# Patient Record
Sex: Female | Born: 1965 | Race: White | Hispanic: No | Marital: Married | Smoking: Former smoker
Health system: Southern US, Community
[De-identification: ages and names within clinical notes are randomized; demographics above are authoritative.]

## PROBLEM LIST (undated history)

## (undated) DIAGNOSIS — B373 Candidiasis of vulva and vagina: Secondary | ICD-10-CM

## (undated) DIAGNOSIS — Z8614 Personal history of Methicillin resistant Staphylococcus aureus infection: Secondary | ICD-10-CM

## (undated) DIAGNOSIS — F419 Anxiety disorder, unspecified: Secondary | ICD-10-CM

## (undated) DIAGNOSIS — R7303 Prediabetes: Secondary | ICD-10-CM

## (undated) DIAGNOSIS — G4730 Sleep apnea, unspecified: Secondary | ICD-10-CM

## (undated) DIAGNOSIS — G4733 Obstructive sleep apnea (adult) (pediatric): Secondary | ICD-10-CM

## (undated) DIAGNOSIS — J302 Other seasonal allergic rhinitis: Secondary | ICD-10-CM

## (undated) DIAGNOSIS — I10 Essential (primary) hypertension: Secondary | ICD-10-CM

## (undated) DIAGNOSIS — Z973 Presence of spectacles and contact lenses: Secondary | ICD-10-CM

## (undated) DIAGNOSIS — E669 Obesity, unspecified: Secondary | ICD-10-CM

## (undated) DIAGNOSIS — J309 Allergic rhinitis, unspecified: Secondary | ICD-10-CM

## (undated) DIAGNOSIS — D1722 Benign lipomatous neoplasm of skin and subcutaneous tissue of left arm: Secondary | ICD-10-CM

## (undated) DIAGNOSIS — F411 Generalized anxiety disorder: Secondary | ICD-10-CM

## (undated) HISTORY — DX: Allergic rhinitis, unspecified: J30.9

## (undated) HISTORY — DX: Personal history of Methicillin resistant Staphylococcus aureus infection: Z86.14

## (undated) HISTORY — DX: Anxiety disorder, unspecified: F41.9

## (undated) HISTORY — DX: Obesity, unspecified: E66.9

## (undated) HISTORY — PX: TONSILLECTOMY: SUR1361

## (undated) HISTORY — DX: Candidiasis of vulva and vagina: B37.3

---

## 1992-03-31 HISTORY — PX: TONSILLECTOMY: SUR1361

## 2006-07-29 ENCOUNTER — Ambulatory Visit: Payer: Self-pay | Admitting: Unknown Physician Specialty

## 2017-01-12 ENCOUNTER — Other Ambulatory Visit: Payer: Self-pay | Admitting: Certified Nurse Midwife

## 2017-01-12 NOTE — Telephone Encounter (Signed)
Please advise for refill. Thank you.  

## 2017-02-04 ENCOUNTER — Other Ambulatory Visit: Payer: Self-pay | Admitting: Certified Nurse Midwife

## 2017-04-09 ENCOUNTER — Other Ambulatory Visit: Payer: Self-pay | Admitting: Certified Nurse Midwife

## 2017-04-09 NOTE — Telephone Encounter (Signed)
Please advise for refill. Thank you.  

## 2017-04-17 ENCOUNTER — Ambulatory Visit (INDEPENDENT_AMBULATORY_CARE_PROVIDER_SITE_OTHER): Payer: BLUE CROSS/BLUE SHIELD | Admitting: Certified Nurse Midwife

## 2017-04-17 ENCOUNTER — Encounter: Payer: Self-pay | Admitting: Certified Nurse Midwife

## 2017-04-17 VITALS — BP 138/82 | HR 77 | Ht 67.0 in | Wt 267.0 lb

## 2017-04-17 DIAGNOSIS — Z124 Encounter for screening for malignant neoplasm of cervix: Secondary | ICD-10-CM | POA: Diagnosis not present

## 2017-04-17 DIAGNOSIS — Z01419 Encounter for gynecological examination (general) (routine) without abnormal findings: Secondary | ICD-10-CM

## 2017-04-17 DIAGNOSIS — Z1231 Encounter for screening mammogram for malignant neoplasm of breast: Secondary | ICD-10-CM

## 2017-04-17 DIAGNOSIS — R87610 Atypical squamous cells of undetermined significance on cytologic smear of cervix (ASC-US): Secondary | ICD-10-CM | POA: Diagnosis not present

## 2017-04-17 DIAGNOSIS — Z1211 Encounter for screening for malignant neoplasm of colon: Secondary | ICD-10-CM

## 2017-04-17 DIAGNOSIS — Z1239 Encounter for other screening for malignant neoplasm of breast: Secondary | ICD-10-CM

## 2017-04-17 DIAGNOSIS — F419 Anxiety disorder, unspecified: Secondary | ICD-10-CM

## 2017-04-17 MED ORDER — LEVONORGESTREL-ETHINYL ESTRAD 0.1-20 MG-MCG PO TABS: 1.0000 | ORAL_TABLET | Freq: Every day | ORAL | 11 refills | Status: DC

## 2017-04-17 MED ORDER — SERTRALINE HCL 50 MG PO TABS: ORAL_TABLET | ORAL | 0 refills | Status: DC

## 2017-04-17 NOTE — Progress Notes (Signed)
Gynecology Annual Exam  PCP: Patient, No Pcp Per  Chief Complaint:  Chief Complaint  Patient presents with  . Gynecologic Exam    History of Present Illness:Leslie Ross presents today for her annual exam. She is a 52 year old Caucasian/White female , G1 P1 whose LMP was 04/02/2017.   Her menses are monthly and last 7 days with two heavier days requiring tampon change q2-3hours are are rarely accompanied by clots. Bleeding may begin at the end of her third week of pills. She reports mild dysmenorrhea not requiring medication. The patient's past medical history is notable for a history of anxiety, obesity (BMI 41), and allergies. Currently on Zoloft 50 mgm daily for anxiety. Has been wakening more frequently with anxiety recently (mother's dementia has increased her anxiety) and is wondering if her Zoloft dose needs to be increased. She is sexually active. She is currently using birth control pills for contraception.  Her most recent pap smear was obtained 03/05/2016 and was ASCUS with negative HPV DNA.  Her most recent mammogram obtained on 03/02/2015 was normal. There is no family history of breast cancer. There is no family history of ovarian cancer. The patient does do monthly self breast exams.  The patient does not smoke.  The patient does drink occasionally.  The patient does not use illegal drugs.  The patient exercises regularly, 4x/week, in the gym, alternating cardio and weights. Her current BMI=41.8 kg/m2 The patient does not get adequate calcium in her diet and takes a supplement.  She has had a recent cholesterol screen 2017 and it was normal.      Review of Systems: Review of Systems  Constitutional: Negative for chills, fever and weight loss.  HENT: Positive for congestion. Negative for sinus pain and sore throat.   Eyes: Negative for blurred vision and pain.  Respiratory: Positive for cough. Negative for hemoptysis, shortness of breath and wheezing.     Cardiovascular: Negative for chest pain, palpitations and leg swelling.  Gastrointestinal: Negative for abdominal pain, blood in stool, diarrhea, heartburn, nausea and vomiting.  Genitourinary: Negative for dysuria, frequency, hematuria and urgency.  Musculoskeletal: Negative for back pain, joint pain and myalgias.  Skin: Negative for itching and rash.  Neurological: Negative for dizziness, tingling and headaches.  Endo/Heme/Allergies: Negative for environmental allergies and polydipsia. Does not bruise/bleed easily.       Negative for hot flashes   Psychiatric/Behavioral: Negative for depression. The patient is nervous/anxious. The patient does not have insomnia.     Past Medical History:  Past Medical History:  Diagnosis Date  . Allergic rhinitis   . Anxiety   . Candidal vaginitis   . History of MRSA infection   . Obesity    BMI 41.8 04/17/2017    Past Surgical History:  Past Surgical History:  Procedure Laterality Date  . CESAREAN SECTION  12/1997  . TONSILLECTOMY      Family History:  Family History  Problem Relation Age of Onset  . Dementia Mother 108       Alzheimers  . Heart disease Father   . Aneurysm Father   . Diabetes Maternal Grandmother   . Heart disease Paternal Grandmother   . Melanoma Maternal Grandfather     Social History:  Social History   Socioeconomic History  . Marital status: Married    Spouse name: Dominica Severin  . Number of children: 1  . Years of education: 12+  . Highest education level: Not on file  Social Needs  .  Financial resource strain: Not on file  . Food insecurity - worry: Not on file  . Food insecurity - inability: Not on file  . Transportation needs - medical: Not on file  . Transportation needs - non-medical: Not on file  Occupational History  . Not on file  Tobacco Use  . Smoking status: Former Research scientist (life sciences)  . Smokeless tobacco: Never Used  . Tobacco comment: quit smoking in 2009  Substance and Sexual Activity  . Alcohol use:  Yes    Comment: occasionally  . Drug use: No  . Sexual activity: Yes    Partners: Male    Birth control/protection: Pill  Other Topics Concern  . Not on file  Social History Narrative  . Not on file    Allergies:  No Known Allergies  Medications:  Current Outpatient Medications:  .  b complex vitamins capsule, Take 1 capsule by mouth daily., Disp: , Rfl:  .  Biotin 1 MG CAPS, Take 1 capsule by mouth daily., Disp: , Rfl:  .  calcium-vitamin D (OSCAL WITH D) 500-200 MG-UNIT tablet, Take 1 tablet by mouth., Disp: , Rfl:  .  cetirizine (ZYRTEC) 10 MG tablet, Take 10 mg by mouth daily., Disp: , Rfl:  .  Cholecalciferol (VITAMIN D3) 1000 units CAPS, Take 1 capsule by mouth daily., Disp: , Rfl:  .  Cupric Sulfate (CU-25) 25 MG CAPS, Take by mouth., Disp: , Rfl:  .  levonorgestrel-ethinyl estradiol (VIENVA) 0.1-20 MG-MCG tablet, Take 1 tablet by mouth daily., Disp: 28 tablet, Rfl: 11 .  magnesium oxide (MAG-OX) 400 MG tablet, Take 400 mg by mouth daily., Disp: , Rfl:  .  sertraline (ZOLOFT) 50 MG tablet, Take 75 mgm daily ( 1.5 tablets), Disp: 45 tablet, Rfl: 0           Physical Exam Vitals: BP 138/82   Pulse 77   Ht 5\' 7"  (1.702 m)   Wt 267 lb (121.1 kg)   LMP 04/02/2017 (Exact Date)   BMI 41.82 kg/m   General: Wf in NAD HEENT: normocephalic, anicteric Neck: no thyroid enlargement, no palpable nodules, no cervical lymphadenopathy  Pulmonary: No increased work of breathing, CTAB Cardiovascular: RRR, without murmur  Breast: Breast symmetrical, no tenderness, no palpable nodules or masses, no skin or nipple retraction present, no nipple discharge.  No axillary, infraclavicular or supraclavicular lymphadenopathy. Abdomen: Soft, non-tender,obese, non-distended.  Umbilicus without lesions.  No hepatomegaly or masses palpable. No evidence of hernia. Genitourinary:  External: Normal external female genitalia.  Normal urethral meatus, normal Bartholin's and Skene's glands.    Vagina:  Normal vaginal mucosa, no evidence of prolapse.    Cervix: Grossly normal in appearance, no bleeding, non-tender  Uterus: Retroverted, normal size, shape, and consistency, mobile, and non-tender  Adnexa: No adnexal masses, non-tender  Rectal: deferred  Lymphatic: no evidence of inguinal lymphadenopathy Extremities: no edema, erythema, or tenderness Neurologic: Grossly intact Psychiatric: mood appropriate, affect full  GAD 7 score on Zoloft 50 mgm daily is 7 and somewhat difficult    Assessment: 52 y.o. annual gyn exam Anxiety ASCUS PAP with negative HRHPV   Plan:   1) Breast cancer screening - recommend monthly self breast exam and annual screening mammograms Mammogram was ordered today.  2) Colon cancer screening-will refer for colonoscopy  3) Cervical cancer screening - Pap was done.  4) Contraception - Refill of Aviane generic sent to pharmacy #1/RF x 11  5) Routine healthcare maintenance including cholesterol and diabetes screening UTD   6) Increased dose of  Zoloft to 75 mgm daily. Telephone follow up in 3 months or sooner if needed  7) RTO 1 year for annual and prn  Dalia Heading, CNM

## 2017-04-20 LAB — IGP,RFX APTIMA HPV ALL PTH: PAP Smear Comment: 0

## 2017-05-04 ENCOUNTER — Telehealth: Payer: Self-pay

## 2017-05-04 NOTE — Telephone Encounter (Signed)
Gastroenterology Pre-Procedure Review  Request Date:   Requesting Physician: Dr. Marius Ditch   PATIENT REVIEW QUESTIONS: The patient responded to the following health history questions as indicated:    1. Are you having any GI issues? No  2. Do you have a personal history of Polyps? No  3. Do you have a family history of Colon Cancer or Polyps? No  4. Diabetes Mellitus?  No  5. Joint replacements in the past 12 months? No  6. Major health problems in the past 3 months? No  7. Any artificial heart valves, MVP, or defibrillator? No     MEDICATIONS & ALLERGIES:    Patient reports the following regarding taking any anticoagulation/antiplatelet therapy:   Plavix, Coumadin, Eliquis, Xarelto, Lovenox, Pradaxa, Brilinta, or Effient? No  Aspirin? No   Patient confirms/reports the following medications:  Current Outpatient Medications  Medication Sig Dispense Refill  . b complex vitamins capsule Take 1 capsule by mouth daily.    . Biotin 1 MG CAPS Take 1 capsule by mouth daily.    . calcium-vitamin D (OSCAL WITH D) 500-200 MG-UNIT tablet Take 1 tablet by mouth.    . cetirizine (ZYRTEC) 10 MG tablet Take 10 mg by mouth daily.    . Cholecalciferol (VITAMIN D3) 1000 units CAPS Take 1 capsule by mouth daily.    . Cupric Sulfate (CU-25) 25 MG CAPS Take by mouth.    . levonorgestrel-ethinyl estradiol (VIENVA) 0.1-20 MG-MCG tablet Take 1 tablet by mouth daily. 28 tablet 11  . magnesium oxide (MAG-OX) 400 MG tablet Take 400 mg by mouth daily.    . sertraline (ZOLOFT) 50 MG tablet Take 75 mgm daily ( 1.5 tablets) 45 tablet 0   No current facility-administered medications for this visit.     Patient confirms/reports the following allergies:  No Known Allergies  No orders of the defined types were placed in this encounter.   AUTHORIZATION INFORMATION Primary Insurance: 1D#: Group #:  Secondary Insurance: 1D#: Group #:  SCHEDULE INFORMATION: Date: 05/20/17 Time: Location: [place]

## 2017-05-05 ENCOUNTER — Other Ambulatory Visit: Payer: Self-pay

## 2017-05-05 DIAGNOSIS — Z1211 Encounter for screening for malignant neoplasm of colon: Secondary | ICD-10-CM

## 2017-05-05 DIAGNOSIS — Z1212 Encounter for screening for malignant neoplasm of rectum: Principal | ICD-10-CM

## 2017-05-11 ENCOUNTER — Other Ambulatory Visit: Payer: Self-pay

## 2017-05-11 DIAGNOSIS — Z1211 Encounter for screening for malignant neoplasm of colon: Secondary | ICD-10-CM

## 2017-05-20 ENCOUNTER — Ambulatory Visit: Admit: 2017-05-20 | Payer: Self-pay | Admitting: Gastroenterology

## 2017-05-20 SURGERY — COLONOSCOPY WITH PROPOFOL
Anesthesia: Choice

## 2017-05-25 DIAGNOSIS — I10 Essential (primary) hypertension: Secondary | ICD-10-CM | POA: Diagnosis not present

## 2017-05-25 DIAGNOSIS — J069 Acute upper respiratory infection, unspecified: Secondary | ICD-10-CM | POA: Diagnosis not present

## 2017-06-29 ENCOUNTER — Encounter: Payer: Self-pay | Admitting: Emergency Medicine

## 2017-06-30 ENCOUNTER — Encounter
Admission: RE | Disposition: A | Discharge status: Home / Self Care | Payer: Self-pay | Source: Ambulatory Visit | Attending: Gastroenterology

## 2017-06-30 ENCOUNTER — Ambulatory Visit
Admission: RE | Admit: 2017-06-30 | Discharge: 2017-06-30 | Disposition: A | Discharge status: Home / Self Care | Payer: BLUE CROSS/BLUE SHIELD | Source: Ambulatory Visit | Attending: Gastroenterology | Admitting: Gastroenterology

## 2017-06-30 ENCOUNTER — Ambulatory Visit: Payer: BLUE CROSS/BLUE SHIELD | Admitting: Anesthesiology

## 2017-06-30 ENCOUNTER — Other Ambulatory Visit: Payer: Self-pay

## 2017-06-30 ENCOUNTER — Encounter: Payer: Self-pay | Admitting: *Deleted

## 2017-06-30 DIAGNOSIS — Z8614 Personal history of Methicillin resistant Staphylococcus aureus infection: Secondary | ICD-10-CM | POA: Insufficient documentation

## 2017-06-30 DIAGNOSIS — K5732 Diverticulitis of large intestine without perforation or abscess without bleeding: Secondary | ICD-10-CM | POA: Diagnosis not present

## 2017-06-30 DIAGNOSIS — Z87891 Personal history of nicotine dependence: Secondary | ICD-10-CM | POA: Insufficient documentation

## 2017-06-30 DIAGNOSIS — D125 Benign neoplasm of sigmoid colon: Secondary | ICD-10-CM | POA: Diagnosis not present

## 2017-06-30 DIAGNOSIS — Z6841 Body Mass Index (BMI) 40.0 and over, adult: Secondary | ICD-10-CM | POA: Diagnosis not present

## 2017-06-30 DIAGNOSIS — K5730 Diverticulosis of large intestine without perforation or abscess without bleeding: Secondary | ICD-10-CM | POA: Diagnosis not present

## 2017-06-30 DIAGNOSIS — Z1211 Encounter for screening for malignant neoplasm of colon: Secondary | ICD-10-CM | POA: Diagnosis not present

## 2017-06-30 DIAGNOSIS — Z79899 Other long term (current) drug therapy: Secondary | ICD-10-CM | POA: Insufficient documentation

## 2017-06-30 DIAGNOSIS — K635 Polyp of colon: Secondary | ICD-10-CM | POA: Diagnosis not present

## 2017-06-30 DIAGNOSIS — Z8249 Family history of ischemic heart disease and other diseases of the circulatory system: Secondary | ICD-10-CM | POA: Insufficient documentation

## 2017-06-30 DIAGNOSIS — F419 Anxiety disorder, unspecified: Secondary | ICD-10-CM | POA: Insufficient documentation

## 2017-06-30 HISTORY — PX: COLONOSCOPY WITH PROPOFOL: SHX5780

## 2017-06-30 SURGERY — COLONOSCOPY WITH PROPOFOL
Anesthesia: General

## 2017-06-30 MED ORDER — PROPOFOL 500 MG/50ML IV EMUL
INTRAVENOUS | Status: AC
  Filled 2017-06-30: qty 100

## 2017-06-30 MED ORDER — LACTATED RINGERS IV SOLN
INTRAVENOUS | Status: DC | PRN
  Administered 2017-06-30: 08:00:00 via INTRAVENOUS

## 2017-06-30 MED ORDER — PHENYLEPHRINE HCL 10 MG/ML IJ SOLN
INTRAMUSCULAR | Status: AC
  Filled 2017-06-30: qty 2

## 2017-06-30 MED ORDER — LIDOCAINE HCL (PF) 2 % IJ SOLN
INTRAMUSCULAR | Status: AC
  Filled 2017-06-30: qty 10

## 2017-06-30 MED ORDER — GLYCOPYRROLATE 0.2 MG/ML IJ SOLN
INTRAMUSCULAR | Status: AC
  Filled 2017-06-30: qty 1

## 2017-06-30 MED ORDER — PROPOFOL 10 MG/ML IV BOLUS
INTRAVENOUS | Status: DC | PRN
  Administered 2017-06-30: 40 mg via INTRAVENOUS
  Administered 2017-06-30: 60 mg via INTRAVENOUS

## 2017-06-30 MED ORDER — PROPOFOL 500 MG/50ML IV EMUL
INTRAVENOUS | Status: DC | PRN
  Administered 2017-06-30: 120 ug/kg/min via INTRAVENOUS

## 2017-06-30 MED ORDER — SODIUM CHLORIDE 0.9 % IV SOLN
INTRAVENOUS | Status: DC
  Administered 2017-06-30: 07:00:00 via INTRAVENOUS

## 2017-06-30 NOTE — Anesthesia Postprocedure Evaluation (Signed)
Anesthesia Post Note  Patient: Leslie Ross  Procedure(s) Performed: COLONOSCOPY WITH PROPOFOL (N/A )  Patient location during evaluation: PACU Anesthesia Type: General Level of consciousness: awake and alert Pain management: pain level controlled Vital Signs Assessment: post-procedure vital signs reviewed and stable Respiratory status: spontaneous breathing, nonlabored ventilation, respiratory function stable and patient connected to nasal cannula oxygen Cardiovascular status: blood pressure returned to baseline and stable Postop Assessment: no apparent nausea or vomiting Anesthetic complications: no     Last Vitals:  Vitals:   06/30/17 0834 06/30/17 0844  BP: 137/71 140/84  Pulse: 67   Resp: 17   Temp: (!) 36.2 C   SpO2: 99%     Last Pain:  Vitals:   06/30/17 0844  TempSrc:   PainSc: 0-No pain                 Molli Barrows

## 2017-06-30 NOTE — Transfer of Care (Signed)
Immediate Anesthesia Transfer of Care Note  Patient: Leslie Ross  Procedure(s) Performed: COLONOSCOPY WITH PROPOFOL (N/A )  Patient Location: PACU  Anesthesia Type:General  Level of Consciousness: awake, alert  and oriented  Airway & Oxygen Therapy: Patient Spontanous Breathing  Post-op Assessment: Report given to RN and Post -op Vital signs reviewed and stable  Post vital signs: Reviewed and stable  Last Vitals:  Vitals Value Taken Time  BP 137/71 06/30/2017  8:35 AM  Temp 36.2 C 06/30/2017  8:34 AM  Pulse 67 06/30/2017  8:36 AM  Resp 14 06/30/2017  8:36 AM  SpO2 100 % 06/30/2017  8:36 AM  Vitals shown include unvalidated device data.  Last Pain:  Vitals:   06/30/17 0834  TempSrc: Tympanic  PainSc:          Complications: No apparent anesthesia complications

## 2017-06-30 NOTE — Anesthesia Procedure Notes (Signed)
Date/Time: 06/30/2017 8:15 AM Performed by: Doreen Salvage, CRNA Pre-anesthesia Checklist: Patient identified, Emergency Drugs available, Suction available and Patient being monitored Patient Re-evaluated:Patient Re-evaluated prior to induction Oxygen Delivery Method: Nasal cannula Induction Type: IV induction Dental Injury: Teeth and Oropharynx as per pre-operative assessment  Comments: Nasal cannula with etCO2 monitoring

## 2017-06-30 NOTE — Op Note (Signed)
[facility] Gastroenterology Patient Name: Leslie Ross Procedure Date: 06/30/2017 8:09 AM MRN: [id number] Account #: [id number] Date of Birth: 18-Jun-1965 Admit Type: Outpatient Age: 52 Room: [facility] ENDO ROOM 4 Gender: Female Note Status: Finalized Procedure:            Colonoscopy Indications:          Screening for colorectal malignant neoplasm Providers:            [name] MD, MD Referring MD:         No Local Md, MD (Referring MD) Medicines:            Propofol per Anesthesia Complications:        No immediate complications. Procedure:            Pre-Anesthesia Assessment:                       - Prior to the procedure, a History and Physical was                        performed, and patient medications and allergies were                        reviewed. The patient's tolerance of previous                        anesthesia was also reviewed. The risks and benefits of                        the procedure and the sedation options and risks were                        discussed with the patient. All questions were                        answered, and informed consent was obtained. Prior                        Anticoagulants: The patient has taken no previous                        anticoagulant or antiplatelet agents. ASA Grade                        Assessment: II - A patient with mild systemic disease.                        After reviewing the risks and benefits, the patient was                        deemed in satisfactory condition to undergo the                        procedure.                       After obtaining informed consent, the colonoscope was                        passed under direct vision. Throughout the procedure,  the patient's blood pressure, pulse, and oxygen                        saturations were monitored continuously. The                        Colonoscope was introduced through the anus and        advanced to the the cecum, identified by appendiceal                        orifice and ileocecal valve. The colonoscopy was                        performed without difficulty. The patient tolerated the                        procedure well. The quality of the bowel preparation                        was excellent. Findings:      The perianal and digital rectal examinations were normal.      Three sessile polyps were found in the sigmoid colon. The polyps were 1       to 3 mm in size. These polyps were removed with a cold biopsy forceps.       Resection and retrieval were complete.      Multiple small-mouthed diverticula were found in the sigmoid colon. Impression:           - Three 1 to 3 mm polyps in the sigmoid colon, removed                        with a cold biopsy forceps. Resected and retrieved.                       - Diverticulosis in the sigmoid colon. Recommendation:       - Discharge patient to home.                       - Resume previous diet.                       - Continue present medications.                       - Await pathology results.                       - Repeat colonoscopy in 5 years if polyp adenoma and 10                        years if hyperplastic Procedure Code(s):    --- Professional ---                       [phone], Colonoscopy, flexible; with biopsy, single or                        multiple Diagnosis Code(s):    --- Professional ---                       Z12.11, Encounter for screening for  malignant neoplasm                        of colon                       D12.5, Benign neoplasm of sigmoid colon CPT copyright 2017 American Medical Association. All rights reserved. The codes documented in this report are preliminary and upon coder review may  be revised to meet current compliance requirements. [name] MD, MD 06/30/2017 8:32:03 AM This report has been signed electronically. Number of Addenda: 0 Note Initiated On: 06/30/2017 8:09 AM Scope  Withdrawal Time: 0 hours 8 minutes 8 seconds  Total Procedure Duration: 0 hours 12 minutes 5 seconds       [facility]

## 2017-06-30 NOTE — Anesthesia Preprocedure Evaluation (Signed)
Anesthesia Evaluation  Patient identified by MRN, date of birth, ID band Patient awake    Reviewed: Allergy & Precautions, H&P , NPO status , Patient's Chart, lab work & pertinent test results, reviewed documented beta blocker date and time   Airway Mallampati: II   Neck ROM: full    Dental  (+) Poor Dentition, Teeth Intact   Pulmonary neg pulmonary ROS, former smoker,    Pulmonary exam normal        Cardiovascular negative cardio ROS Normal cardiovascular exam Rhythm:regular Rate:Normal     Neuro/Psych Anxiety negative neurological ROS  negative psych ROS   GI/Hepatic negative GI ROS, Neg liver ROS,   Endo/Other  negative endocrine ROSMorbid obesity  Renal/GU negative Renal ROS  negative genitourinary   Musculoskeletal   Abdominal   Peds  Hematology negative hematology ROS (+)   Anesthesia Other Findings Past Medical History: No date: Allergic rhinitis No date: Anxiety No date: Candidal vaginitis No date: History of MRSA infection No date: Obesity     Comment:  BMI 41.8 04/17/2017 Past Surgical History: 12/1997: CESAREAN SECTION No date: TONSILLECTOMY BMI    Body Mass Index:  41.82 kg/m     Reproductive/Obstetrics negative OB ROS                             Anesthesia Physical Anesthesia Plan  ASA: III  Anesthesia Plan: General   Post-op Pain Management:    Induction:   PONV Risk Score and Plan:   Airway Management Planned:   Additional Equipment:   Intra-op Plan:   Post-operative Plan:   Informed Consent: I have reviewed the patients History and Physical, chart, labs and discussed the procedure including the risks, benefits and alternatives for the proposed anesthesia with the patient or authorized representative who has indicated his/her understanding and acceptance.   Dental Advisory Given  Plan Discussed with: CRNA  Anesthesia Plan Comments:          Anesthesia Quick Evaluation

## 2017-06-30 NOTE — Anesthesia Post-op Follow-up Note (Signed)
Anesthesia QCDR form completed.        

## 2017-06-30 NOTE — H&P (Signed)
Leslie Lame, MD [address] Leslie Ross, [place] [address] Phone: [phone] Fax : [phone]  Primary Care Physician:  Patient, No Pcp Per Primary Gastroenterologist:  Dr. Allen Norris  Pre-Procedure History & Physical: HPI:  Leslie Ross is a 52 y.o. female is here for a screening colonoscopy.   Past Medical History:  Diagnosis Date  . Allergic rhinitis   . Anxiety   . Candidal vaginitis   . History of MRSA infection   . Obesity    BMI 41.8 04/17/2017    Past Surgical History:  Procedure Laterality Date  . CESAREAN SECTION  12/1997  . TONSILLECTOMY      Prior to Admission medications   Medication Sig Start Date End Date Taking? Authorizing Provider  b complex vitamins capsule Take 1 capsule by mouth daily.   Yes [provider]  Biotin 1 MG CAPS Take 1 capsule by mouth daily.   Yes [provider]  calcium-vitamin D (OSCAL WITH D) 500-200 MG-UNIT tablet Take 1 tablet by mouth.   Yes [provider]  cetirizine (ZYRTEC) 10 MG tablet Take 10 mg by mouth daily.   Yes [provider]  Cholecalciferol (VITAMIN D3) 1000 units CAPS Take 1 capsule by mouth daily.   Yes [provider]  Cupric Sulfate (CU-25) 25 MG CAPS Take by mouth.   Yes [provider]  levonorgestrel-ethinyl estradiol (VIENVA) 0.1-20 MG-MCG tablet Take 1 tablet by mouth daily. 04/17/17  Yes Dalia Heading, CNM  magnesium oxide (MAG-OX) 400 MG tablet Take 400 mg by mouth daily.   Yes [provider]  sertraline (ZOLOFT) 50 MG tablet Take 75 mgm daily ( 1.5 tablets) 04/17/17  Yes Dalia Heading, CNM    Allergies as of 05/11/2017  . (No Known Allergies)    Family History  Problem Relation Age of Onset  . Dementia Mother 11       Alzheimers  . Heart disease Father   . Aneurysm Father   . Diabetes Maternal Grandmother   . Heart disease Paternal Grandmother   . Melanoma Maternal Grandfather     Social History    Socioeconomic History  . Marital status: Married    Spouse name: Dominica Severin  . Number of children: 1  . Years of education: 12+  . Highest education level: Not on file  Occupational History  . Not on file  Social Needs  . Financial resource strain: Not on file  . Food insecurity:    Worry: Not on file    Inability: Not on file  . Transportation needs:    Medical: Not on file    Non-medical: Not on file  Tobacco Use  . Smoking status: Former Research scientist (life sciences)  . Smokeless tobacco: Never Used  . Tobacco comment: quit smoking in 2009  Substance and Sexual Activity  . Alcohol use: Yes    Comment: occasionally  . Drug use: No  . Sexual activity: Yes    Partners: Male    Birth control/protection: Pill  Lifestyle  . Physical activity:    Days per week: 5 days    Minutes per session: 60 min  . Stress: To some extent  Relationships  . Social connections:    Talks on phone: Three times a week    Gets together: Never    Attends religious service: Never    Active member of club or organization: No    Attends meetings of clubs or organizations: Never    Relationship status: Married  . Intimate  partner violence:    Fear of current or ex partner: No    Emotionally abused: No    Physically abused: No    Forced sexual activity: No  Other Topics Concern  . Not on file  Social History Narrative  . Not on file    Review of Systems: See HPI, otherwise negative ROS  Physical Exam: BP (!) 159/88   Pulse 72   Temp 98.5 F (36.9 C) (Tympanic)   Resp 18   Ht 5\' 7"  (1.702 m)   Wt 267 lb (121.1 kg)   LMP 06/17/2017 (Approximate)   SpO2 98%   BMI 41.82 kg/m  General:   Alert,  pleasant and cooperative in NAD Head:  Normocephalic and atraumatic. Neck:  Supple; no masses or thyromegaly. Lungs:  Clear throughout to auscultation.    Heart:  Regular rate and rhythm. Abdomen:  Soft, nontender and nondistended. Normal bowel sounds, without guarding, and without rebound.   Neurologic:  Alert  and  oriented x4;  grossly normal neurologically.  Impression/Plan: Leslie Ross is now here to undergo a screening colonoscopy.  Risks, benefits, and alternatives regarding colonoscopy have been reviewed with the patient.  Questions have been answered.  All parties agreeable.

## 2017-07-01 LAB — SURGICAL PATHOLOGY

## 2017-07-02 ENCOUNTER — Encounter: Payer: Self-pay | Admitting: Gastroenterology

## 2017-07-03 ENCOUNTER — Encounter: Payer: Self-pay | Admitting: Gastroenterology

## 2017-07-09 ENCOUNTER — Other Ambulatory Visit: Payer: Self-pay | Admitting: Certified Nurse Midwife

## 2017-07-09 NOTE — Telephone Encounter (Signed)
Please advise for refill. Per last note telephone follow up in 3 months or as needed. Thank you!

## 2017-07-15 ENCOUNTER — Telehealth: Payer: Self-pay | Admitting: Certified Nurse Midwife

## 2017-07-15 NOTE — Telephone Encounter (Signed)
CAlled patient to see if she was feeling better since her Zoloft was increased to 75 gm daily. She does report having less anxiety and better sleep since increasing the dose. Refills called in x 1 year. She has had some blood pressure elevations recently. Advised to take daily and keep a log and let me know if her BP is persistently 140/90 or higher. Dalia Heading, CNM

## 2018-03-05 ENCOUNTER — Other Ambulatory Visit: Payer: Self-pay | Admitting: Certified Nurse Midwife

## 2018-04-01 ENCOUNTER — Other Ambulatory Visit: Payer: Self-pay

## 2018-04-01 MED ORDER — LEVONORGESTREL-ETHINYL ESTRAD 0.1-20 MG-MCG PO TABS
1.0000 | ORAL_TABLET | Freq: Every day | ORAL | 0 refills | Status: DC
Start: 2018-04-01 — End: 2018-06-16

## 2018-04-20 ENCOUNTER — Ambulatory Visit: Payer: BLUE CROSS/BLUE SHIELD | Admitting: Certified Nurse Midwife

## 2018-05-13 NOTE — Telephone Encounter (Signed)
This encounter was created in error - please disregard.

## 2018-05-13 NOTE — Progress Notes (Signed)
Gynecology Annual Exam  PCP: Patient, No Pcp Per  Chief Complaint:  Chief Complaint  Patient presents with  . Gynecologic Exam    History of Present Illness:Leslie Ross presents today for her annual exam. She is a 53 year old Caucasian/White female , G1 P1 whose LMP was 04/20/18.   Her menses are monthly and last 5- 7 days with two heavier days requiring tampon change q2-3 hours. Bleeding may begin at the end of her third week of pills or start at the beginning of her pack. She reports mild dysmenorrhea not requiring medication. She has been experiencing hot flashes/ night sweats The patient's past medical history is notable for a history of anxiety, obesity (BMI 43.4 kg/m2), and allergies. Currently on Zoloft 50 mgm daily for anxiety. Her Zoloft was increased for a short time last year to 75 mgm daily while dealing with her mother's dementia. Her mother is currently at [facility]. She is sexually active. She is currently using birth control pills for contraception.  Her most recent pap smear was obtained 04/17/2017 and was NIL Her most recent mammogram obtained on 03/02/2015 was normal. There is no family history of breast cancer. There is no family history of ovarian cancer. The patient does do monthly self breast exams.  Had a colonoscopy 06/30/2017 and results: one hyperplastic polyp. Needs next one in 10 years. The patient does not smoke.  The patient does drink occasionally.  The patient does not use illegal drugs.  The patient exercises regularly, 4x/week, in the gym, alternating cardio and weights. She has gained 10# since her last annual. The patient does not get adequate calcium in her diet and takes a supplement.  She has had a recent cholesterol screen 2017 and it was normal. Hemoglobin A1C was just above normal at 5.6%.     Review of Systems: Review of Systems  Constitutional: Negative for chills, fever and weight loss.  HENT: Negative for congestion,  sinus pain and sore throat.   Eyes: Negative for blurred vision and pain.  Respiratory: Negative for cough, hemoptysis, shortness of breath and wheezing.   Cardiovascular: Negative for chest pain, palpitations and leg swelling.  Gastrointestinal: Negative for abdominal pain, blood in stool, diarrhea, heartburn, nausea and vomiting.  Genitourinary: Negative for dysuria, frequency, hematuria and urgency.  Musculoskeletal: Negative for back pain, joint pain and myalgias.  Skin: Negative for itching and rash.  Neurological: Negative for dizziness, tingling and headaches.  Endo/Heme/Allergies: Positive for environmental allergies. Negative for polydipsia. Does not bruise/bleed easily.       Positive for hot flashes   Psychiatric/Behavioral: Negative for depression. The patient is nervous/anxious. The patient does not have insomnia.     Past Medical History:  Past Medical History:  Diagnosis Date  . Allergic rhinitis   . Anxiety   . Candidal vaginitis   . History of MRSA infection   . Obesity    BMI 41.8 04/17/2017    Past Surgical History:  Past Surgical History:  Procedure Laterality Date  . CESAREAN SECTION  12/1997  . COLONOSCOPY WITH PROPOFOL N/A 06/30/2017   Procedure: COLONOSCOPY WITH PROPOFOL;  Surgeon: [name], MD;  Location: [facility] ENDOSCOPY;  Service: Endoscopy;  Laterality: N/A;  . TONSILLECTOMY      Family History:  Family History  Problem Relation Age of Onset  . Dementia Mother 81       Alzheimers  . Heart disease Father   . Aneurysm Father   . Diabetes Maternal  Grandmother        type 1  . Heart disease Maternal Grandmother   . Heart disease Paternal Grandmother     Social History:  Social History   Socioeconomic History  . Marital status: Married    Spouse name: Dominica Severin  . Number of children: 1  . Years of education: 12+  . Highest education level: Not on file  Occupational History  . Not on file  Social Needs  . Financial resource strain: Not on  file  . Food insecurity:    Worry: Not on file    Inability: Not on file  . Transportation needs:    Medical: Not on file    Non-medical: Not on file  Tobacco Use  . Smoking status: Former Research scientist (life sciences)  . Smokeless tobacco: Never Used  . Tobacco comment: quit smoking in 2009  Substance and Sexual Activity  . Alcohol use: Yes    Comment: occasionally  . Drug use: No  . Sexual activity: Yes    Partners: Male    Birth control/protection: Pill  Lifestyle  . Physical activity:    Days per week: 5 days    Minutes per session: 60 min  . Stress: To some extent  Relationships  . Social connections:    Talks on phone: Three times a week    Gets together: Never    Attends religious service: Never    Active member of club or organization: No    Attends meetings of clubs or organizations: Never    Relationship status: Married  . Intimate partner violence:    Fear of current or ex partner: No    Emotionally abused: No    Physically abused: No    Forced sexual activity: No  Other Topics Concern  . Not on file  Social History Narrative  . Not on file    Allergies:  No Known Allergies  Medications:  Current Outpatient Medications:  .  b complex vitamins capsule, Take 1 capsule by mouth daily., Disp: , Rfl:  .  Biotin 1 MG CAPS, Take 1 capsule by mouth daily., Disp: , Rfl:  .  calcium-vitamin D (OSCAL WITH D) 500-200 MG-UNIT tablet, Take 1 tablet by mouth., Disp: , Rfl:  .  cetirizine (ZYRTEC) 10 MG tablet, Take 10 mg by mouth daily., Disp: , Rfl:  .  Cholecalciferol (VITAMIN D3) 1000 units CAPS, Take 1 capsule by mouth daily., Disp: , Rfl:  .  levonorgestrel-ethinyl estradiol (VIENVA) 0.1-20 MG-MCG tablet, Take 1 tablet by mouth daily., Disp: 84 tablet, Rfl: 0 .  magnesium oxide (MAG-OX) 400 MG tablet, Take 400 mg by mouth daily., Disp: , Rfl:  .  sertraline (ZOLOFT) 50 MG tablet, TAKE ONE AND A HALF TABLETS BY MOUTH EVERY DAY, Disp: 45 tablet, Rfl: 11           Physical  Exam Vitals: BP (!) 150/90   Pulse 71   Ht 5\' 7"  (1.702 m)   Wt 277 lb (125.6 kg)   LMP 04/23/2018 (Approximate)   BMI 43.38 kg/m  Initial BP160/90 General: Wf in NAD HEENT: normocephalic, anicteric Neck: no thyroid enlargement, no palpable nodules, no cervical lymphadenopathy  Pulmonary: No increased work of breathing, CTAB Cardiovascular: RRR, without murmur  Breast: Breast symmetrical, no tenderness, no palpable nodules or masses, no skin or nipple retraction present, no nipple discharge.  No axillary, infraclavicular or supraclavicular lymphadenopathy. Abdomen: Soft, non-tender,obese, non-distended.  Umbilicus without lesions.  No hepatomegaly or masses palpable. No evidence of hernia. Genitourinary:  External: Normal external female genitalia.  Normal urethral meatus, normal Bartholin's and Skene's glands.    Vagina: Normal vaginal mucosa, no evidence of prolapse.    Cervix: Grossly normal in appearance, no bleeding, non-tender  Uterus: Retroflexed, normal size, shape, and consistency, mobile, and non-tender  Adnexa: No adnexal masses, non-tender  Rectal: deferred  Lymphatic: no evidence of inguinal lymphadenopathy Extremities: no edema, erythema, or tenderness Neurologic: Grossly intact Psychiatric: mood appropriate, affect full      Assessment: 53 y.o. annual gyn exam Anxiety Elevated blood pressure   Plan:   1) Breast cancer screening - recommend monthly self breast exam and annual screening mammograms Mammogram was ordered today. Patient to schedule at [facility]  2) Colon cancer screening-UTD colonoscopy  3) Cervical cancer screening - Pap was done.  4) Contraception - discussed changing pills due to elevated blood pressures. Pressures in the past have been borderline 130s/80 to mildly elevated 150s/90s. Will try on POP. Sample packs of Slynd given to patient with directions on how to take pills. FU in 6 weeks for a blood pressure check.  5) Labs including  cholesterol, CMP, TSH, and diabetes screening done today.    6) Continue 50 mgm Zoloft daily   Dalia Heading, [place]

## 2018-05-14 ENCOUNTER — Ambulatory Visit (INDEPENDENT_AMBULATORY_CARE_PROVIDER_SITE_OTHER): Payer: BLUE CROSS/BLUE SHIELD | Admitting: Certified Nurse Midwife

## 2018-05-14 ENCOUNTER — Encounter: Payer: Self-pay | Admitting: Certified Nurse Midwife

## 2018-05-14 ENCOUNTER — Other Ambulatory Visit (HOSPITAL_COMMUNITY)
Admission: RE | Admit: 2018-05-14 | Discharge: 2018-05-14 | Disposition: A | Discharge status: Home / Self Care | Payer: BLUE CROSS/BLUE SHIELD | Source: Ambulatory Visit | Attending: Certified Nurse Midwife | Admitting: Certified Nurse Midwife

## 2018-05-14 VITALS — BP 150/90 | HR 71 | Ht 67.0 in | Wt 277.0 lb

## 2018-05-14 DIAGNOSIS — Z6841 Body Mass Index (BMI) 40.0 and over, adult: Secondary | ICD-10-CM

## 2018-05-14 DIAGNOSIS — Z01419 Encounter for gynecological examination (general) (routine) without abnormal findings: Secondary | ICD-10-CM | POA: Diagnosis not present

## 2018-05-14 DIAGNOSIS — Z124 Encounter for screening for malignant neoplasm of cervix: Secondary | ICD-10-CM | POA: Diagnosis not present

## 2018-05-14 DIAGNOSIS — R7309 Other abnormal glucose: Secondary | ICD-10-CM | POA: Diagnosis not present

## 2018-05-14 DIAGNOSIS — R030 Elevated blood-pressure reading, without diagnosis of hypertension: Secondary | ICD-10-CM | POA: Diagnosis not present

## 2018-05-14 DIAGNOSIS — Z1239 Encounter for other screening for malignant neoplasm of breast: Secondary | ICD-10-CM

## 2018-05-15 LAB — CMP14+EGFR
ALT: 11 IU/L (ref 0–32)
AST: 14 IU/L (ref 0–40)
Albumin/Globulin Ratio: 1.8 (ref 1.2–2.2)
Albumin: 4.4 g/dL (ref 3.8–4.8)
Alkaline Phosphatase: 60 IU/L (ref 39–117)
BUN/Creatinine Ratio: 17 (ref 9–23)
BUN: 13 mg/dL (ref 6–24)
Bilirubin Total: 0.5 mg/dL (ref 0.0–1.2)
CO2: 19 mmol/L — ABNORMAL LOW (ref 20–29)
Calcium: 9.7 mg/dL (ref 8.7–10.2)
Chloride: 103 mmol/L (ref 96–106)
Creatinine, Ser: 0.78 mg/dL (ref 0.57–1.00)
GFR calc Af Amer: 103 mL/min/{1.73_m2} (ref >59)
GFR calc non Af Amer: 89 mL/min/{1.73_m2} (ref >59)
Globulin, Total: 2.5 g/dL (ref 1.5–4.5)
Glucose: 101 mg/dL — ABNORMAL HIGH (ref 65–99)
Potassium: 4.4 mmol/L (ref 3.5–5.2)
Sodium: 138 mmol/L (ref 134–144)
Total Protein: 6.9 g/dL (ref 6.0–8.5)

## 2018-05-15 LAB — HEMOGLOBIN A1C
Est. average glucose Bld gHb Est-mCnc: 117 mg/dL
Hgb A1c MFr Bld: 5.7 % — ABNORMAL HIGH (ref 4.8–5.6)

## 2018-05-15 LAB — TSH: TSH: 1.71 u[IU]/mL (ref 0.450–4.500)

## 2018-05-18 LAB — CYTOLOGY - PAP
Diagnosis: NEGATIVE
HPV: NOT DETECTED

## 2018-05-19 DIAGNOSIS — J209 Acute bronchitis, unspecified: Secondary | ICD-10-CM | POA: Diagnosis not present

## 2018-05-25 ENCOUNTER — Ambulatory Visit
Admission: RE | Admit: 2018-05-25 | Discharge: 2018-05-25 | Disposition: A | Discharge status: Home / Self Care | Payer: BLUE CROSS/BLUE SHIELD | Source: Ambulatory Visit | Attending: Certified Nurse Midwife | Admitting: Certified Nurse Midwife

## 2018-05-25 DIAGNOSIS — Z1231 Encounter for screening mammogram for malignant neoplasm of breast: Secondary | ICD-10-CM | POA: Insufficient documentation

## 2018-05-25 DIAGNOSIS — Z1239 Encounter for other screening for malignant neoplasm of breast: Secondary | ICD-10-CM

## 2018-06-16 ENCOUNTER — Ambulatory Visit (INDEPENDENT_AMBULATORY_CARE_PROVIDER_SITE_OTHER): Payer: BLUE CROSS/BLUE SHIELD | Admitting: Certified Nurse Midwife

## 2018-06-16 ENCOUNTER — Encounter: Payer: Self-pay | Admitting: Certified Nurse Midwife

## 2018-06-16 ENCOUNTER — Other Ambulatory Visit: Payer: Self-pay

## 2018-06-16 VITALS — BP 146/90 | HR 73 | Ht 67.0 in | Wt 279.0 lb

## 2018-06-16 DIAGNOSIS — Z3041 Encounter for surveillance of contraceptive pills: Secondary | ICD-10-CM | POA: Diagnosis not present

## 2018-06-16 DIAGNOSIS — R030 Elevated blood-pressure reading, without diagnosis of hypertension: Secondary | ICD-10-CM

## 2018-06-16 MED ORDER — DROSPIRENONE 4 MG PO TABS: 1.0000 | ORAL_TABLET | Freq: Every day | ORAL | 0 refills | Status: DC

## 2018-06-16 NOTE — Progress Notes (Signed)
  History of Present Illness:  Leslie Ross is a 53 y.o. who was started on Slynd (POP) approximately 1 month ago. She was switched from an OCP because of high blood pressure. She presents for a blood pressure check. Has not had any breakthrough bleeding thus far on her pill.  PMHx: She  has a past medical history of Allergic rhinitis, Anxiety, Candidal vaginitis, History of MRSA infection, and Obesity. Also,  has a past surgical history that includes Cesarean section (12/1997); Tonsillectomy; and Colonoscopy with propofol (N/A, 06/30/2017)., family history includes Aneurysm in her father; Dementia (age of onset: 83) in her mother; Diabetes in her maternal grandmother; Heart disease in her father, maternal grandmother, and paternal grandmother.,  reports that she has quit smoking. She has never used smokeless tobacco. She reports current alcohol use. She reports that she does not use drugs.  She has a current medication list which includes the following prescription(s): b complex vitamins, biotin, calcium-vitamin d, cetirizine, vitamin d3, levonorgestrel-ethinyl estradiol, magnesium oxide, and sertraline. Also, has No Known Allergies.  ROS  Physical Exam:  BP (!) 146/90   Pulse 73   Ht 5\' 7"  (1.702 m)   Wt 279 lb (126.6 kg)   LMP 05/24/2018   BMI 43.70 kg/m  Body mass index is 43.7 kg/m. Constitutional: Well nourished, well developed female in no acute distress.  Neuro: Grossly intact Psych:  Normal mood and affect.    Assessment: Medication treatment is going well. Blood pressure remains elevated  Plan: She will undergo no change in her pills. Continue Slynd. Advised to get an appointment with PCP (going to see if see can get an appointment with her husband's PCP for evaluation and treatment of HTN. Recommend decreasing salt intake and decreasing CHOs to help lose weight.  She was amenable to this plan and we will see her back for annual/PRN.  Leslie Ross, CNM [facility], [facility] 06/16/2018  7:35 PM

## 2018-07-13 ENCOUNTER — Encounter: Payer: Self-pay | Admitting: Family Medicine

## 2018-07-13 ENCOUNTER — Ambulatory Visit: Payer: Self-pay | Admitting: Family Medicine

## 2018-07-13 ENCOUNTER — Ambulatory Visit (INDEPENDENT_AMBULATORY_CARE_PROVIDER_SITE_OTHER): Payer: BLUE CROSS/BLUE SHIELD | Admitting: Family Medicine

## 2018-07-13 ENCOUNTER — Other Ambulatory Visit: Payer: Self-pay

## 2018-07-13 VITALS — BP 175/76 | HR 79 | Ht 67.0 in | Wt 276.0 lb

## 2018-07-13 DIAGNOSIS — I10 Essential (primary) hypertension: Secondary | ICD-10-CM | POA: Diagnosis not present

## 2018-07-13 DIAGNOSIS — Z7689 Persons encountering health services in other specified circumstances: Secondary | ICD-10-CM | POA: Diagnosis not present

## 2018-07-13 DIAGNOSIS — R7309 Other abnormal glucose: Secondary | ICD-10-CM | POA: Diagnosis not present

## 2018-07-13 MED ORDER — HYDROCHLOROTHIAZIDE 12.5 MG PO TABS: 12.5000 mg | ORAL_TABLET | Freq: Every day | ORAL | 1 refills | Status: DC

## 2018-07-13 NOTE — Assessment & Plan Note (Addendum)
New diagnosis, chronic uncontrolled HTN, remote treatment >10 years ago Elevated home Bp readings, question accuracy of wrist cuff, prior reading avg other doctor office 599/35  No known complications Morbid obesity On OCP per GYN   Plan:  1. New start - HCTZ 12.5mg  daily - for BP and edema 2. Encourage improved lifestyle - low sodium diet, regular exercise 3. Start monitor BP outside office, with new cuff for arm, bring readings to next visit, if persistently >140/90 or new symptoms notify office sooner 4. Follow-up in 3 months for HTN med adjust  May notify us in 1 month for updates on BP readings, mychart, and will likely recommend a BMET lab ordered for 1 month from now to check chemistry and Cr kidney on thiazide - will notify once we hear update on her BP readings - if we have to add ACEi - then will delay lab for another 2 weeks

## 2018-07-13 NOTE — Progress Notes (Signed)
Subjective:    Patient ID: Leslie Ross, female    DOB: 1966/01/01, 53 y.o.   MRN: [id number]  Leslie Ross is a 53 y.o. female presenting on 07/13/2018 for Establish Care (HTN) and Hypertension  Virtual / Telehealth Encounter - Video Visit via [email or web address] The purpose of this virtual visit is to provide medical care while limiting exposure to the novel coronavirus (COVID19) for both patient and office staff.  Consent was obtained for remote visit:  Yes.   Answered questions that patient had about telehealth interaction:  Yes.   I discussed the limitations, risks, security and privacy concerns of performing an evaluation and management service by video/telephone. I also discussed with the patient that there may be a patient responsible charge related to this service. The patient expressed understanding and agreed to proceed.  Patient Location: Home Provider Location: [facility] (Office)  HPI   CHRONIC HTN: History of elevated BP for months to years >140/90s. In past >10+ year ago was treated with medication for HTN, believes it was a fluid pill. Has been off med for many years. Now sees OBGYN routinely asked to come here for new PCP due to HTN. Reports checks home BP occasionally, has wrist cuff, reading today BP 175/76 left arm, seems to be variable, but on average reporting usually 140s/90s.  She plans to get an arm BP cuff instead of wrist. Current Meds - None   Lifestyle: - Diet: reducing sodium salt in diet,  - Exercise: limited exercise  Admits occasional edema in feet/ankles at times, worse if prolong sitting Denies CP, dyspnea, HA, edema, dizziness / lightheadedness  Irregular Menstrual Cycle Followed by Dalia Heading CNM [facility] On OCP, she is not in menopause at this time, she has some peri-menopausal symptoms, including temperature instability.  Health Maintenance: Already completed annual physical per [facility] in 05/2018 Up to date  colonoscopy on file in chart.  Depression screen PHQ 2/9 07/13/2018  Decreased Interest 0  Down, Depressed, Hopeless 0  PHQ - 2 Score 0    Past Medical History:  Diagnosis Date  . Allergic rhinitis   . Anxiety   . Candidal vaginitis   . History of MRSA infection   . Obesity    BMI 41.8 04/17/2017   Past Surgical History:  Procedure Laterality Date  . CESAREAN SECTION  12/1997  . COLONOSCOPY WITH PROPOFOL N/A 06/30/2017   Procedure: COLONOSCOPY WITH PROPOFOL;  Surgeon: [name], MD;  Location: [facility] ENDOSCOPY;  Service: Endoscopy;  Laterality: N/A;  . TONSILLECTOMY     Social History   Socioeconomic History  . Marital status: Married    Spouse name: Dominica Severin  . Number of children: 1  . Years of education: 12+  . Highest education level: Not on file  Occupational History  . Not on file  Social Needs  . Financial resource strain: Not on file  . Food insecurity:    Worry: Not on file    Inability: Not on file  . Transportation needs:    Medical: Not on file    Non-medical: Not on file  Tobacco Use  . Smoking status: Former Research scientist (life sciences)  . Smokeless tobacco: Former Systems developer  . Tobacco comment: quit smoking in 2009  Substance and Sexual Activity  . Alcohol use: Yes    Comment: occasionally  . Drug use: No  . Sexual activity: Yes    Partners: Male    Birth control/protection: Pill  Lifestyle  . Physical activity:  Days per week: 5 days    Minutes per session: 60 min  . Stress: To some extent  Relationships  . Social connections:    Talks on phone: Three times a week    Gets together: Never    Attends religious service: Never    Active member of club or organization: No    Attends meetings of clubs or organizations: Never    Relationship status: Married  . Intimate partner violence:    Fear of current or ex partner: No    Emotionally abused: No    Physically abused: No    Forced sexual activity: No  Other Topics Concern  . Not on file  Social History Narrative  .  Not on file   Family History  Problem Relation Age of Onset  . Dementia Mother 68       Alzheimers  . Heart disease Father   . Aneurysm Father   . Diabetes Maternal Grandmother        type 1  . Heart disease Maternal Grandmother   . Heart disease Paternal Grandmother   . Breast cancer Neg Hx    Current Outpatient Medications on File Prior to Visit  Medication Sig  . b complex vitamins capsule Take 1 capsule by mouth daily.  . Biotin 1 MG CAPS Take 1 capsule by mouth daily.  . calcium-vitamin D (OSCAL WITH D) 500-200 MG-UNIT tablet Take 1 tablet by mouth.  . cetirizine (ZYRTEC) 10 MG tablet Take 10 mg by mouth daily.  . Cholecalciferol (VITAMIN D3) 1000 units CAPS Take 1 capsule by mouth daily.  . Drospirenone (SLYND) 4 MG TABS Take 1 tablet by mouth daily.  . magnesium oxide (MAG-OX) 400 MG tablet Take 400 mg by mouth daily.  . sertraline (ZOLOFT) 50 MG tablet TAKE ONE AND A HALF TABLETS BY MOUTH EVERY DAY   No current facility-administered medications on file prior to visit.     Review of Systems Per HPI unless specifically indicated above      Objective:    BP (!) 175/76 Comment: patient's machine  Pulse 79 Comment: patient's machine  Ht '5\' 7"'$  (1.702 m)   Wt 276 lb (125.2 kg)   BMI 43.23 kg/m   Wt Readings from Last 3 Encounters:  07/13/18 276 lb (125.2 kg)  06/16/18 279 lb (126.6 kg)  05/14/18 277 lb (125.6 kg)    Physical Exam   Note examination was completely remotely via video observation objective data only  Gen - well-appearing, no acute distress or apparent pain, comfortable HEENT - eyes appear clear without discharge or redness Heart/Lungs - cannot examine virtually - observed no evidence of coughing or labored breathing. Skin - face visible today- no rash Neuro - awake, alert, oriented Psych - not anxious appearing   Results for orders placed or performed in visit on 05/14/18  CMP14+EGFR  Result Value Ref Range   Glucose 101 (H) 65 - 99 mg/dL    BUN 13 6 - 24 mg/dL   Creatinine, Ser 0.78 0.57 - 1.00 mg/dL   GFR calc non Af Amer 89 >59 mL/min/1.73   GFR calc Af Amer 103 >59 mL/min/1.73   BUN/Creatinine Ratio 17 9 - 23   Sodium 138 134 - 144 mmol/L   Potassium 4.4 3.5 - 5.2 mmol/L   Chloride 103 96 - 106 mmol/L   CO2 19 (L) 20 - 29 mmol/L   Calcium 9.7 8.7 - 10.2 mg/dL   Total Protein 6.9 6.0 - 8.5 g/dL  Albumin 4.4 3.8 - 4.8 g/dL   Globulin, Total 2.5 1.5 - 4.5 g/dL   Albumin/Globulin Ratio 1.8 1.2 - 2.2   Bilirubin Total 0.5 0.0 - 1.2 mg/dL   Alkaline Phosphatase 60 39 - 117 IU/L   AST 14 0 - 40 IU/L   ALT 11 0 - 32 IU/L  Hemoglobin A1c  Result Value Ref Range   Hgb A1c MFr Bld 5.7 (H) 4.8 - 5.6 %   Est. average glucose Bld gHb Est-mCnc 117 mg/dL  TSH  Result Value Ref Range   TSH 1.710 0.450 - 4.500 uIU/mL  Cytology - PAP  Result Value Ref Range   Adequacy      Satisfactory for evaluation  endocervical/transformation zone component PRESENT.   Diagnosis      NEGATIVE FOR INTRAEPITHELIAL LESIONS OR MALIGNANCY.   HPV NOT DETECTED    Material Submitted CervicoVaginal Pap [ThinPrep Imaged]    CYTOLOGY - PAP PAP RESULT       Assessment & Plan:   Problem List Items Addressed This Visit    Elevated hemoglobin A1c    Elevated A1c to 5.7 previously Risk with morbid obesity, and HTN  Encourage lifestyle improvement low carb low sugar, regular exercise Follow-up in future for repeat A1c q 6-12 months      Essential hypertension - Primary    New diagnosis, chronic uncontrolled HTN, remote treatment >10 years ago Elevated home Bp readings, question accuracy of wrist cuff, prior reading avg other doctor office 250/53  No known complications Morbid obesity On OCP per GYN   Plan:  1. New start - HCTZ 12.'5mg'$  daily - for BP and edema 2. Encourage improved lifestyle - low sodium diet, regular exercise 3. Start monitor BP outside office, with new cuff for arm, bring readings to next visit, if persistently >140/90 or new  symptoms notify office sooner 4. Follow-up in 3 months for HTN med adjust  May notify us in 1 month for updates on BP readings, mychart, and will likely recommend a BMET lab ordered for 1 month from now to check chemistry and Cr kidney on thiazide - will notify once we hear update on her BP readings - if we have to add ACEi - then will delay lab for another 2 weeks      Relevant Medications   hydrochlorothiazide (HYDRODIURIL) 12.5 MG tablet   Other Relevant Orders   BASIC METABOLIC PANEL WITH GFR    Other Visit Diagnoses    Encounter to establish care with new doctor          Meds ordered this encounter  Medications  . hydrochlorothiazide (HYDRODIURIL) 12.5 MG tablet    Sig: Take 1 tablet (12.5 mg total) by mouth daily.    Dispense:  90 tablet    Refill:  1   Follow-up: - Contact us in 1 month for update via phone call or mychart with BP readings, or questions, and we will recommend Lab Only - BMET in 1 month at that time, to check Cr and electrolytes on thiazide - Return in 3 months for follow-up visit HTN  Patient verbalizes understanding with the above medical recommendations including the limitation of remote medical advice.  Specific follow-up and call-back criteria were given for patient to follow-up or seek medical care more urgently if needed.  Total duration of direct patient care provided via video conference: 20 minutes   Nobie Putnam, [facility] 07/13/2018, 3:31 PM

## 2018-07-13 NOTE — Patient Instructions (Addendum)
Due to your elevated blood pressure, consistent with new diagnosis Hypertension  Start Hydrochlorothiazide (HCTZ) 12.5mg  once daily - this is a fluid pill and BP pill - may make you urinate more often, it should help reduce swelling  Check BP at home, get an arm cuff as discussed - keep track of BP occasionally, different time of day, not every day.  If BP is improved < 140/90 then we are at goal, if elevated above >140/90 persistently, need to contact us back to adjust med - may consider double dose 12.5mg  x 2 = 25mg  daily OR we can add low dose Lisinopril  CALL IN 1 MONTH or MYCHART IN 1 MONTH for BP update with readings - also we will recommend a Blood Test to make sure medicine is working well and check kidney function  ----------------  DUE for FASTING BLOOD WORK (no food or drink after midnight before the lab appointment, only water or coffee without cream/sugar on the morning of)  SCHEDULE "Lab Only" visit in the morning at the clinic for lab draw in  1 MONTH  - Make sure Lab Only appointment is at about 1 week before your next appointment, so that results will be available  For Lab Results, once available within 2-3 days of blood draw, you can can log in to MyChart online to view your results and a brief explanation. Also, we can discuss results at next follow-up visit.   Please schedule a Follow-up Appointment to: Return in about 3 months (around 10/12/2018) for HTN.  If you have any other questions or concerns, please feel free to call the office or send a message through [place]. You may also schedule an earlier appointment if necessary.  Additionally, you may be receiving a survey about your experience at our office within a few days to 1 week by e-mail or mail. We value your feedback.  Nobie Putnam, DO [facility]

## 2018-07-13 NOTE — Assessment & Plan Note (Signed)
Elevated A1c to 5.7 previously Risk with morbid obesity, and HTN  Encourage lifestyle improvement low carb low sugar, regular exercise Follow-up in future for repeat A1c q 6-12 months

## 2018-07-16 ENCOUNTER — Telehealth: Payer: Self-pay

## 2018-07-17 MED ORDER — DROSPIRENONE 4 MG PO TABS: 1.0000 | ORAL_TABLET | Freq: Every day | ORAL | 1 refills | Status: DC

## 2018-07-19 NOTE — Telephone Encounter (Signed)
Leslie Ross from [facility] called to let us know she doesn't have Slynd in stock; wholesaler doesn't have it so she cannot order it.  Do you want to change rx or call around to see who may have it?

## 2018-07-20 ENCOUNTER — Other Ambulatory Visit: Payer: Self-pay | Admitting: Certified Nurse Midwife

## 2018-07-20 MED ORDER — SERTRALINE HCL 50 MG PO TABS: ORAL_TABLET | ORAL | 11 refills | Status: DC

## 2018-07-20 NOTE — Telephone Encounter (Signed)
Will give her samples for now. Leslie Ross

## 2018-10-06 ENCOUNTER — Other Ambulatory Visit: Payer: Self-pay | Admitting: Certified Nurse Midwife

## 2018-10-06 MED ORDER — NORETHINDRONE 0.35 MG PO TABS: 1.0000 | ORAL_TABLET | Freq: Every day | ORAL | 3 refills | Status: DC

## 2018-10-06 NOTE — Telephone Encounter (Signed)
Leslie Ross was not approved thru insurance. Will try another progesterone only pill like Camila. Explained difference to Leslie Ross and how to take pills. Having mild menses on the Leslie Ross that lasts 2 days.

## 2018-10-12 ENCOUNTER — Other Ambulatory Visit: Payer: Self-pay | Admitting: Family Medicine

## 2018-10-12 DIAGNOSIS — I10 Essential (primary) hypertension: Secondary | ICD-10-CM

## 2019-04-25 ENCOUNTER — Other Ambulatory Visit: Payer: Self-pay | Admitting: Certified Nurse Midwife

## 2019-04-25 ENCOUNTER — Telehealth: Payer: Self-pay | Admitting: Certified Nurse Midwife

## 2019-04-25 DIAGNOSIS — N939 Abnormal uterine and vaginal bleeding, unspecified: Secondary | ICD-10-CM

## 2019-04-25 NOTE — Telephone Encounter (Signed)
Patient is schedule for 05/23/19

## 2019-04-25 NOTE — Telephone Encounter (Signed)
-----   Message from Dalia Heading, [place] sent at 04/25/2019  1:45 PM EST ----- Regarding: Appointment Hi Clarise Cruz, Please call patient and schedule annual and pelvic ultrasound for AUB. Thank you, Jaclyn Shaggy

## 2019-05-22 NOTE — Progress Notes (Signed)
Gynecology Annual Exam  PCP: Dalia Heading, CNM  Chief Complaint:  Chief Complaint  Patient presents with  . Gynecologic Exam    AUB;     History of Present Illness:Leslie Ross presents today for her annual exam and with complaints of abnormal uterine bleeding. She is a 54 year old Caucasian/White female , G1 P1 whose LMP was 05/20/2019 Last year her OCPs were switched to the minipill due to hypertension. She was on [facility] for several months then was switched to norethindrone. She had no menses for the first couple on months on the norethindrone, then in the last 1-2 months started to have menses every 2 weeks. Her bleeding has been lasting 7-10 days, with 2-3 heavy days requiring ultra tampon changes every 1 hour, and are with quarter sized clots. Has cramping relieved with Aleve.  The patient's past medical history is notable for a history of anxiety, obesity (BMI 43.22), hypertension, and allergies. Currently on Zoloft 50 mgm daily for anxiety. Took 75 mgm Zoloft for a few months last year when her anxiety worsened due to her mother's dementia. She was begun on HCTZ last year for hypertension She is sexually active. She is currently using minipills for contraception.  Her most recent pap smear was obtained 05/14/2018 and was NIL with negative HPV DNA.  Her most recent mammogram obtained on 05/25/2018 was normal. There is no family history of breast cancer. There is no family history of ovarian cancer. The patient does do monthly self breast exams.  The patient does not smoke.  The patient does drink occasionally.  The patient does not use illegal drugs.  The patient exercises regularly, 2x/week, in the gym, alternating cardio and weights. She also does yoga. The patient does not get adequate calcium in her diet and takes a supplement.  She has had a recent cholesterol screen 2017 and it was normal.      Review of Systems: Review of Systems  Constitutional: Negative for  chills, fever and weight loss.  HENT: Negative for congestion, sinus pain and sore throat.   Eyes: Negative for blurred vision and pain.  Respiratory: Negative for cough, hemoptysis, shortness of breath and wheezing.   Cardiovascular: Negative for chest pain, palpitations and leg swelling.  Gastrointestinal: Negative for abdominal pain, blood in stool, diarrhea, heartburn, nausea and vomiting.  Genitourinary: Negative for dysuria, frequency, hematuria and urgency.       Positive for menometrorrhagia  Musculoskeletal: Negative for back pain, joint pain and myalgias.  Skin: Negative for itching and rash.  Neurological: Negative for dizziness, tingling and headaches.  Endo/Heme/Allergies: Positive for environmental allergies (with sneezing). Negative for polydipsia. Does not bruise/bleed easily.       Negative for hot flashes   Psychiatric/Behavioral: Negative for depression. The patient is not nervous/anxious and does not have insomnia.     Past Medical History:  Past Medical History:  Diagnosis Date  . Allergic rhinitis   . Anxiety   . Candidal vaginitis   . History of MRSA infection   . Obesity    BMI 41.8 04/17/2017    Past Surgical History:  Past Surgical History:  Procedure Laterality Date  . CESAREAN SECTION  12/1997  . COLONOSCOPY WITH PROPOFOL N/A 06/30/2017   Procedure: COLONOSCOPY WITH PROPOFOL;  Surgeon: [name], MD;  Location: [facility] ENDOSCOPY;  Service: Endoscopy;  Laterality: N/A;  . TONSILLECTOMY      Family History:  Family History  Problem Relation Age of Onset  .  Dementia Mother 81       Alzheimers  . Heart disease Father   . Aneurysm Father   . Diabetes Maternal Grandmother        type 1  . Heart disease Maternal Grandmother   . Heart disease Paternal Grandmother   . Breast cancer Neg Hx     Social History:  Social History   Socioeconomic History  . Marital status: Married    Spouse name: Dominica Severin  . Number of children: 1  . Years of education:  12+  . Highest education level: Not on file  Occupational History  . Not on file  Tobacco Use  . Smoking status: Former Research scientist (life sciences)  . Smokeless tobacco: Former Systems developer  . Tobacco comment: quit smoking in 2009  Substance and Sexual Activity  . Alcohol use: Yes    Comment: occasionally  . Drug use: No  . Sexual activity: Yes    Partners: Male    Birth control/protection: Pill  Other Topics Concern  . Not on file  Social History Narrative  . Not on file   Social Determinants of Health   Financial Resource Strain:   . Difficulty of Paying Living Expenses: Not on file  Food Insecurity:   . Worried About Charity fundraiser in the Last Year: Not on file  . Ran Out of Food in the Last Year: Not on file  Transportation Needs:   . Lack of Transportation (Medical): Not on file  . Lack of Transportation (Non-Medical): Not on file  Physical Activity:   . Days of Exercise per Week: Not on file  . Minutes of Exercise per Session: Not on file  Stress:   . Feeling of Stress : Not on file  Social Connections:   . Frequency of Communication with Friends and Family: Not on file  . Frequency of Social Gatherings with Friends and Family: Not on file  . Attends Religious Services: Not on file  . Active Member of Clubs or Organizations: Not on file  . Attends Archivist Meetings: Not on file  . Marital Status: Not on file  Intimate Partner Violence:   . Fear of Current or Ex-Partner: Not on file  . Emotionally Abused: Not on file  . Physically Abused: Not on file  . Sexually Abused: Not on file    Allergies:  No Known Allergies  Medications:  Current Outpatient Medications:  .  b complex vitamins capsule, Take 1 capsule by mouth daily., Disp: , Rfl:  .  Biotin 1 MG CAPS, Take 1 capsule by mouth daily., Disp: , Rfl:  .  calcium-vitamin D (OSCAL WITH D) 500-200 MG-UNIT tablet, Take 1 tablet by mouth., Disp: , Rfl:  .  cetirizine (ZYRTEC) 10 MG tablet, Take 10 mg by mouth daily.,  Disp: , Rfl:  .  Collagen Hydrolysate, Bovine, POWD, Take 1 Dose by mouth daily. Dose is 2 tbsp, Disp: , Rfl:  .  Garlic (GARLIQUE) A999333 MG TBEC, Take 1 tablet by mouth daily., Disp: , Rfl:  .  hydrochlorothiazide (HYDRODIURIL) 12.5 MG tablet, TAKE ONE TABLET BY MOUTH EVERY DAY, Disp: 90 tablet, Rfl: 1 .  magnesium oxide (MAG-OX) 400 MG tablet, Take 400 mg by mouth daily., Disp: , Rfl:  .  norethindrone (MICRONOR) 0.35 MG tablet, Take 1 tablet (0.35 mg total) by mouth daily., Disp: 3 Package, Rfl: 3 .  Omega-3 Fatty Acids (FISH OIL) 435 MG CAPS, Take 1 capsule by mouth daily., Disp: , Rfl:  .  OVER  THE COUNTER MEDICATION, Take 1 capsule by mouth daily., Disp: , Rfl:  .  sertraline (ZOLOFT) 50 MG tablet, TAKE ONE TABLET BY MOUTH EVERY DAY, Disp: 30 tablet, Rfl: 11 .  Turmeric 400 MG CAPS, Take 1 capsule by mouth daily., Disp: , Rfl:  .  Cholecalciferol (VITAMIN D3) 1000 units CAPS, Take 1 capsule by mouth daily., Disp: , Rfl:            Physical Exam Vitals: BP 138/90   Pulse 60   Ht 5\' 7"  (1.702 m)   Wt 276 lb (125.2 kg)   LMP 05/20/2019 (Approximate)   BMI 43.23 kg/m   General: WF in NAD HEENT: normocephalic, anicteric Neck: no thyroid enlargement, no palpable nodules, no cervical lymphadenopathy  Pulmonary: No increased work of breathing, CTAB Cardiovascular: RRR, without murmur  Breast: Breast asymmetrical with left>right(no change), no tenderness, no palpable nodules or masses, no skin or nipple retraction present, no nipple discharge.  No axillary, infraclavicular or supraclavicular lymphadenopathy. Abdomen: Soft, non-tender,obese, non-distended.  Umbilicus without lesions.  No hepatomegaly or masses palpable. No evidence of hernia. Genitourinary:  External: Normal external female genitalia.  Normal urethral meatus, normal Bartholin's and Skene's glands.    Vagina: Normal vaginal mucosa, no evidence of prolapse, moderate blood    Cervix: Grossly normal in appearance,   non-tender  Uterus: Anteverted, TLNS, irregular shape, mobile, and non-tender  Adnexa: No adnexal masses, non-tender  Rectal: deferred  Lymphatic: no evidence of inguinal lymphadenopathy Extremities: no edema, erythema, or tenderness Neurologic: Grossly intact Psychiatric: mood appropriate, affect full  TVUS today showed the following: The uterus is anteverted and measures 9.7 x 6.2 x 6.6 cm. Echo texture is heterogenous with evidence of focal masses. Within the uterus are multiple suspected fibroids measuring: Fibroid 1:31.3 x 22.7 x 30.1 mm subserosal anterior Fibroid 2:23.2 x 14.8 x 23.1 mm intramural posterior Fibroid 3: 19.2 x 13.6 x 22.0 mm intramural left Fibroid 4: 8.9 x 7.1 x 9.0 mm intramural posterior The Endometrium measures 7.9 mm.  Right Ovary measures 3.3 x 4.6 x 3.4 cm. There is a simple cyst in the right ovary measuring 32.0 x 27.8 x 26.6 mm  Left Ovary measures 4.9 x 4.9 x 2.9 cm. It is not normal in appearance. There are two simple cysts in the left ovary. The smaller one is a daughter cyst within the larger one. The cysts measure 42.3 x 24.9 x 48.0 mm and 20.3 x 16.2 x 22.9 mm.  Survey of the adnexa demonstrates no adnexal masses. There is no free fluid in the cul de sac.  Assessment: 55 y.o. annual gyn exam Menometrorrhagia  R/O endometrial polyps, R/O hyperplasia Fibroids-most appear subserosal and intramural, rule out submucosal fibroids Ovarian cysts bilaterally   Plan:  Discussed finding of the ultrasound and limitations of ultrasound in diagnosing causes of the AUB. Recommended consulting gynecologist regarding hysteroscopy and possible D&C. Also recommended repeat ultrasound in 6 weeks to follow up on the ovarian cysts. Patient agrees with plan of management.  Had TSH done last year which was normal.    1) Breast cancer screening - recommend monthly self breast exam and annual screening mammograms Mammogram was ordered today.  2) Colon cancer  screening: colonoscopy 2019-UTD. Next due in 10 years  3) Cervical cancer screening - Pap was not done. Next due in 2 years  4) Contraception - continue minipill for now  5) Routine healthcare maintenance including cholesterol and diabetes screening UTD-and now has PCP    Dalia Heading,  CNM

## 2019-05-23 ENCOUNTER — Encounter: Payer: Self-pay | Admitting: Certified Nurse Midwife

## 2019-05-23 ENCOUNTER — Other Ambulatory Visit: Payer: Self-pay

## 2019-05-23 ENCOUNTER — Ambulatory Visit (INDEPENDENT_AMBULATORY_CARE_PROVIDER_SITE_OTHER): Payer: BC Managed Care – PPO

## 2019-05-23 ENCOUNTER — Ambulatory Visit (INDEPENDENT_AMBULATORY_CARE_PROVIDER_SITE_OTHER): Payer: BC Managed Care – PPO | Admitting: Certified Nurse Midwife

## 2019-05-23 VITALS — BP 138/90 | HR 60 | Ht 67.0 in | Wt 276.0 lb

## 2019-05-23 DIAGNOSIS — D252 Subserosal leiomyoma of uterus: Secondary | ICD-10-CM

## 2019-05-23 DIAGNOSIS — Z01411 Encounter for gynecological examination (general) (routine) with abnormal findings: Secondary | ICD-10-CM

## 2019-05-23 DIAGNOSIS — N83291 Other ovarian cyst, right side: Secondary | ICD-10-CM | POA: Diagnosis not present

## 2019-05-23 DIAGNOSIS — N83292 Other ovarian cyst, left side: Secondary | ICD-10-CM

## 2019-05-23 DIAGNOSIS — D251 Intramural leiomyoma of uterus: Secondary | ICD-10-CM

## 2019-05-23 DIAGNOSIS — Z1231 Encounter for screening mammogram for malignant neoplasm of breast: Secondary | ICD-10-CM

## 2019-05-23 DIAGNOSIS — N939 Abnormal uterine and vaginal bleeding, unspecified: Secondary | ICD-10-CM

## 2019-05-23 DIAGNOSIS — N938 Other specified abnormal uterine and vaginal bleeding: Secondary | ICD-10-CM

## 2019-05-23 DIAGNOSIS — Z01419 Encounter for gynecological examination (general) (routine) without abnormal findings: Secondary | ICD-10-CM

## 2019-05-23 DIAGNOSIS — N921 Excessive and frequent menstruation with irregular cycle: Secondary | ICD-10-CM | POA: Insufficient documentation

## 2019-06-06 ENCOUNTER — Encounter: Payer: Self-pay | Admitting: Obstetrics and Gynecology

## 2019-06-06 ENCOUNTER — Other Ambulatory Visit: Payer: Self-pay

## 2019-06-06 ENCOUNTER — Ambulatory Visit (INDEPENDENT_AMBULATORY_CARE_PROVIDER_SITE_OTHER): Payer: BC Managed Care – PPO | Admitting: Obstetrics and Gynecology

## 2019-06-06 VITALS — BP 134/88 | HR 79 | Ht 67.0 in | Wt 275.0 lb

## 2019-06-06 DIAGNOSIS — R9389 Abnormal findings on diagnostic imaging of other specified body structures: Secondary | ICD-10-CM | POA: Diagnosis not present

## 2019-06-06 DIAGNOSIS — N938 Other specified abnormal uterine and vaginal bleeding: Secondary | ICD-10-CM | POA: Diagnosis not present

## 2019-06-06 DIAGNOSIS — N939 Abnormal uterine and vaginal bleeding, unspecified: Secondary | ICD-10-CM

## 2019-06-06 NOTE — Progress Notes (Signed)
Gynecology Abnormal Uterine Bleeding Initial Evaluation   Chief Complaint:  Chief Complaint  Patient presents with  . Cosult    Follow up ultrasound, possible surgery    History of Present Illness:    Paitient is a 54 y.o. G1P1001 who LMP was Patient's last menstrual period was 06/03/2019., presents today for a problem visit.  She has previously undergone a complete work up for abnormal uterine bleeding by Dalia Heading, CNM for abnormal uterine bleeding.  This included ultrasound on 05/23/2019 showing several small intramural and subserosal fibroid, none which appear to contain a submucosal component.  While no clear polyps were visualized on the ultrasound, evaluation of the endometrium was somewhat hampered secondary to acoustic shadowing from the fibroids.  No prior history of endometrial polyp or uterine biopsies but does have a history of colon polyps.  She presents today to discuss follow up management options.   Review of Systems: Review of systems negative unless otherwise noted in HPI  Past Medical History:  Patient Active Problem List   Diagnosis Date Noted  . Menometrorrhagia 05/23/2019  . Essential hypertension 07/13/2018  . Elevated hemoglobin A1c 07/13/2018  . Special screening for malignant neoplasms, colon   . Polyp of sigmoid colon   . Anxiety 04/17/2017  . Morbid obesity ([place]) 04/17/2017    BMI 41.8 kg/m2     Past Surgical History:  Past Surgical History:  Procedure Laterality Date  . CESAREAN SECTION  12/1997  . COLONOSCOPY WITH PROPOFOL N/A 06/30/2017   Procedure: COLONOSCOPY WITH PROPOFOL;  Surgeon: [name], MD;  [facility] ENDOSCOPY;  Service: Endoscopy;  Laterality: N/A;  . TONSILLECTOMY      Obstetric History: G1P1001  Family History:  Family History  Problem Relation Age of Onset  . Dementia Mother 68       Alzheimers  . Heart disease Father   . Aneurysm Father   . Diabetes Maternal Grandmother        type 1  . Heart disease  Maternal Grandmother   . Heart disease Paternal Grandmother   . Breast cancer Neg Hx     Social History:  Social History   Socioeconomic History  . Marital status: Married    Spouse name: Dominica Severin  . Number of children: 1  . Years of education: 12+  . Highest education level: Not on file  Occupational History  . Not on file  Tobacco Use  . Smoking status: Former Research scientist (life sciences)  . Smokeless tobacco: Former Systems developer  . Tobacco comment: quit smoking in 2009  Substance and Sexual Activity  . Alcohol use: Yes    Comment: occasionally  . Drug use: No  . Sexual activity: Yes    Partners: Male    Birth control/protection: Pill  Other Topics Concern  . Not on file  Social History Narrative  . Not on file   Social Determinants of Health   Financial Resource Strain:   . Difficulty of Paying Living Expenses: Not on file  Food Insecurity:   . Worried About Charity fundraiser in the Last Year: Not on file  . Ran Out of Food in the Last Year: Not on file  Transportation Needs:   . Lack of Transportation (Medical): Not on file  . Lack of Transportation (Non-Medical): Not on file  Physical Activity:   . Days of Exercise per Week: Not on file  . Minutes of Exercise per Session: Not on file  Stress:   . Feeling of Stress : Not  on file  Social Connections:   . Frequency of Communication with Friends and Family: Not on file  . Frequency of Social Gatherings with Friends and Family: Not on file  . Attends Religious Services: Not on file  . Active Member of Clubs or Organizations: Not on file  . Attends Archivist Meetings: Not on file  . Marital Status: Not on file  Intimate Partner Violence:   . Fear of Current or Ex-Partner: Not on file  . Emotionally Abused: Not on file  . Physically Abused: Not on file  . Sexually Abused: Not on file    Allergies:  No Known Allergies  Medications: Prior to Admission medications   Medication Sig Start Date End Date Taking? Authorizing  Provider  b complex vitamins capsule Take 1 capsule by mouth daily.    [provider]  Biotin 1 MG CAPS Take 1 capsule by mouth daily.    [provider]  calcium-vitamin D (OSCAL WITH D) 500-200 MG-UNIT tablet Take 1 tablet by mouth.    [provider]  cetirizine (ZYRTEC) 10 MG tablet Take 10 mg by mouth daily.    [provider]  Cholecalciferol (VITAMIN D3) 1000 units CAPS Take 1 capsule by mouth daily.    [provider]  Collagen Hydrolysate, Bovine, POWD Take 1 Dose by mouth daily. Dose is 2 tbsp    [provider]  Garlic (GARLIQUE) A999333 MG TBEC Take 1 tablet by mouth daily.    [provider]  hydrochlorothiazide (HYDRODIURIL) 12.5 MG tablet TAKE ONE TABLET BY MOUTH EVERY DAY 10/12/18   Karamalegos, Devonne Doughty, DO  magnesium oxide (MAG-OX) 400 MG tablet Take 400 mg by mouth daily.    [provider]  norethindrone (MICRONOR) 0.35 MG tablet Take 1 tablet (0.35 mg total) by mouth daily. 10/06/18   Dalia Heading, CNM  Omega-3 Fatty Acids (FISH OIL) 435 MG CAPS Take 1 capsule by mouth daily.    [provider]  OVER THE COUNTER MEDICATION Take 1 capsule by mouth daily.    [provider]  sertraline (ZOLOFT) 50 MG tablet TAKE ONE TABLET BY MOUTH EVERY DAY 07/20/18   Dalia Heading, CNM  Turmeric 400 MG CAPS Take 1 capsule by mouth daily.    [provider]    Physical Exam Blood pressure 134/88, pulse 79, height 5\' 7"  (1.702 m), weight 275 lb (124.7 kg), last menstrual period 06/03/2019.  Patient's last menstrual period was 06/03/2019.  General: NAD, well nourished, appears stated age 44: normocephalic, anicteric Pulmonary: No increased work of breathing Neurologic: Grossly intact Psychiatric: mood appropriate, affect full   Assessment: 53 y.o. G1P1001 with abnormal uterine bleeding  Plan: Problem List Items Addressed This Visit    None    Visit Diagnoses    Abnormal  uterine bleeding (AUB)    -  Primary   Thickened endometrium          1) AUB - discussed the management options for further evaluation of the endometrium.  Given age over 5 endometrial sampling in warranted.  However, this may miss focal lesion and should focal lesions be see hysteroscopy and D&C would be warranted.  SIS would be able to aid in visualizing focal endometrial lesions but may still be hampered by acoustic shadowing from her uterine fibroids.  Some of her bleeding may well be perimenopausal but we discussed the variation in the age of menopause.  While the average age of menopause in the [place] population is 56,  some patient may undergo menopause several years later.  The patient is most interested in pursuing hysteroscopy, D&C for further work up, and we discussed concurrent Mirena IUD placement in order to ensure good cycle suppression going forward.  2) Return if symptoms worsen or fail to improve.   Malachy Mood, MD, [facility], [facility] 06/06/2019, 8:59 PM

## 2019-06-10 ENCOUNTER — Telehealth: Payer: Self-pay | Admitting: Obstetrics and Gynecology

## 2019-06-10 NOTE — Telephone Encounter (Addendum)
Spoke with pt and decided on DOS 4/27 w Dr Georgianne Fick - Mirena needed  H&P 4/13 @ 3:50  Covid testing 4/23 - [address], drive up and wear mask. Adv pt to quar after tested until DOS. Pt understood  Adv that she will be sch for Pre-admit phone appt prior to DOS and appt will be listed on her MyChart as well as on the paperwook she receives at H&P   Pt asked ques about recovery time in order to notify work. I adv that I would forward her question to Dr Georgianne Fick and ask that he or his CMA address her recovery question via a MyChart message to the pt. She conf that method was acceptable.

## 2019-06-10 NOTE — Telephone Encounter (Signed)
Conf ins with pt during call [employer]

## 2019-06-10 NOTE — Telephone Encounter (Signed)
-----   Message from Malachy Mood, MD sent at 06/06/2019  8:57 PM EST ----- Regarding: Surgery Surgery Booking Request Patient Full Name:  Leslie Ross  MRN: [id number]  DOB: March 01, 1966  Surgeon: Malachy Mood, MD  Requested Surgery Date and Time: 2-4 weeks Primary Diagnosis AND Code: AUB N93.9 Secondary Diagnosis and Code:  Thickened endometrium R93.89 Surgical Procedure: Hysteroscopy D&C, Mirena IUD placement L&D Notification: No Admission Status: same day surgery Length of Surgery: 1-hr Special Case Needs: No H&P: Yes Phone Interview???:  No Interpreter: No1 Language:  Medical Clearance:  No Special Scheduling Instructions: none Any known health/anesthesia issues, diabetes, sleep apnea, latex allergy, defibrillator/pacemaker?: No Acuity: P3   (P1 highest, P2 delay may cause harm, P3 low, elective gyn, P4 lowest)

## 2019-06-10 NOTE — Telephone Encounter (Signed)
Noted. Will order to arrive by Surgery date. Will transport to OR if new policy has not been put in place that IUD's must be supplied in house from [facility].

## 2019-06-20 ENCOUNTER — Other Ambulatory Visit: Payer: Self-pay | Admitting: Family Medicine

## 2019-06-20 DIAGNOSIS — I10 Essential (primary) hypertension: Secondary | ICD-10-CM

## 2019-07-11 ENCOUNTER — Other Ambulatory Visit: Payer: Self-pay | Admitting: Certified Nurse Midwife

## 2019-07-12 ENCOUNTER — Ambulatory Visit (INDEPENDENT_AMBULATORY_CARE_PROVIDER_SITE_OTHER): Payer: BC Managed Care – PPO | Admitting: Obstetrics and Gynecology

## 2019-07-12 ENCOUNTER — Other Ambulatory Visit: Payer: Self-pay

## 2019-07-12 ENCOUNTER — Encounter: Payer: Self-pay | Admitting: Obstetrics and Gynecology

## 2019-07-12 VITALS — BP 132/84 | HR 75 | Ht 67.0 in | Wt 274.0 lb

## 2019-07-12 DIAGNOSIS — Z01818 Encounter for other preprocedural examination: Secondary | ICD-10-CM

## 2019-07-12 DIAGNOSIS — N939 Abnormal uterine and vaginal bleeding, unspecified: Secondary | ICD-10-CM

## 2019-07-12 NOTE — Progress Notes (Signed)
Obstetrics & Gynecology Surgery H&P    Chief Complaint: Scheduled Surgery   History of Present Illness: Patient is a 54 y.o. G1P1001 presenting for scheduled hysteroscopy D&C, for the treatment or further evaluation of abnormal uterine bleeding.   Prior Treatments prior to proceeding with surgery include: ultrasound evaluation showing thickened endometrium with concern for endometrial polyp unable to be rule out given shadowing of uterine fibroids  Preoperative Pap: 05/14/2018 Results: NIL HPV negative  Preoperative Endometrial biopsy: N/A possible focal lesion Preoperative Ultrasound: 05/23/2019 with evaluation of endometrium hampered by shadowing of uterine fibroids   Review of Systems:10 point review of systems  Past Medical History:  Patient Active Problem List   Diagnosis Date Noted  . Menometrorrhagia 05/23/2019  . Essential hypertension 07/13/2018  . Elevated hemoglobin A1c 07/13/2018  . Special screening for malignant neoplasms, colon   . Polyp of sigmoid colon   . Anxiety 04/17/2017  . Morbid obesity ([place]) 04/17/2017    BMI 41.8 kg/m2     Past Surgical History:  Past Surgical History:  Procedure Laterality Date  . CESAREAN SECTION  12/1997  . COLONOSCOPY WITH PROPOFOL N/A 06/30/2017   Procedure: COLONOSCOPY WITH PROPOFOL;  Surgeon: [name], MD;  Location: [facility] ENDOSCOPY;  Service: Endoscopy;  Laterality: N/A;  . TONSILLECTOMY      Family History:  Family History  Problem Relation Age of Onset  . Dementia Mother 62       Alzheimers  . Heart disease Father   . Aneurysm Father   . Diabetes Maternal Grandmother        type 1  . Heart disease Maternal Grandmother   . Heart disease Paternal Grandmother   . Breast cancer Neg Hx     Social History:  Social History   Socioeconomic History  . Marital status: Married    Spouse name: Dominica Severin  . Number of children: 1  . Years of education: 12+  . Highest education level: Not on file  Occupational History    . Not on file  Tobacco Use  . Smoking status: Former Research scientist (life sciences)  . Smokeless tobacco: Former Systems developer  . Tobacco comment: quit smoking in 2009  Substance and Sexual Activity  . Alcohol use: Yes    Comment: occasionally  . Drug use: No  . Sexual activity: Yes    Partners: Male    Birth control/protection: Pill  Other Topics Concern  . Not on file  Social History Narrative  . Not on file   Social Determinants of Health   Financial Resource Strain:   . Difficulty of Paying Living Expenses:   Food Insecurity:   . Worried About Charity fundraiser in the Last Year:   . Arboriculturist in the Last Year:   Transportation Needs:   . Film/video editor (Medical):   Marland Kitchen Lack of Transportation (Non-Medical):   Physical Activity:   . Days of Exercise per Week:   . Minutes of Exercise per Session:   Stress:   . Feeling of Stress :   Social Connections:   . Frequency of Communication with Friends and Family:   . Frequency of Social Gatherings with Friends and Family:   . Attends Religious Services:   . Active Member of Clubs or Organizations:   . Attends Archivist Meetings:   Marland Kitchen Marital Status:   Intimate Partner Violence:   . Fear of Current or Ex-Partner:   . Emotionally Abused:   Marland Kitchen Physically Abused:   . Sexually  Abused:     Allergies:  No Known Allergies  Medications: Prior to Admission medications   Medication Sig Start Date End Date Taking? Authorizing Provider  b complex vitamins capsule Take 1 capsule by mouth daily.    [provider]  Calcium Carb-Cholecalciferol (CALCIUM 600+D3 PO) Take 1 tablet by mouth daily.    [provider]  cetirizine (ZYRTEC) 10 MG tablet Take 10 mg by mouth at bedtime.     [provider]  Collagen Hydrolysate, Bovine, POWD Take 1 Scoop by mouth daily. WITH COFFEE    [provider]  fluticasone (FLONASE) 50 MCG/ACT nasal spray Place 1 spray into both nostrils at bedtime.    [provider]  Garlic (GARLIQUE) [id number] MG TBEC Take 1 tablet by mouth daily.    [provider]  hydrochlorothiazide (HYDRODIURIL) 12.5 MG tablet TAKE 1 TABLET BY MOUTH DAILY Patient taking differently: Take 12.5 mg by mouth daily.  06/20/19   Karamalegos, Devonne Doughty, DO  magnesium oxide (MAG-OX) 400 MG tablet Take 400 mg by mouth at bedtime.     [provider]  Multiple Vitamin (MULTIVITAMIN WITH MINERALS) TABS tablet Take 1 tablet by mouth daily.    [provider]  norethindrone (MICRONOR) 0.35 MG tablet Take 1 tablet (0.35 mg total) by mouth daily. 10/06/18   Dalia Heading, CNM  Omega-3 Fatty Acids (OMEGA 3 500) 500 MG CAPS Take 500 mg by mouth daily.    [provider]  OVER THE COUNTER MEDICATION Take 500 mg by mouth daily. [place]    [provider]  sertraline (ZOLOFT) 50 MG tablet TAKE ONE TABLET BY MOUTH EVERY DAY Patient taking differently: Take 50 mg by mouth every evening. TAKE ONE TABLET BY MOUTH EVERY DAY 07/20/18   Dalia Heading, CNM  TURMERIC PO Take 1,000 mg by mouth daily.    [provider]    Physical Exam Vitals: Blood pressure 132/84, pulse 75, height 5\' 7"  (1.702 m), weight 274 lb (124.3 kg), last menstrual period 06/22/2019. General: NAD HEENT: normocephalic, anicteric Pulmonary: No increased work of breathing Cardiovascular: RRR, distal pulses 2+ Abdomen: soft, non-tender Genitourinary: deferred Extremities: no edema, erythema, or tenderness Neurologic: Grossly intact Psychiatric: mood appropriate, affect full  Imaging No results found.  Assessment: 54 y.o. G1P1001 presenting for scheduled hysteroscopy D&C  Plan: 1)I have discussed with the patient the indications for the procedure. Included in the discussion were the options of therapy, as wall as their individual risks, benefits, and complications. Ample time was given to answer all questions.   In office pipelle biopsy generally provides comparable  results to [facility], however this sampling modality may miss focal lesions if these were previously documented on ultrasound.  It is because of the potential to miss focal lesions that hysteroscopy D&C is also warranted in patient with continued postmenopausal bleeding that is not self limited regardless of prior in office biopsy results or ultrasound findings.  She understands that the risk of continued observation include worsening bleeding or worsening of any underlying pathology.  The choices include: 1. Doing nothing but following her symptoms 2. Attempts at hormonal manipulation with either BCP or Depo-Provera for premenopausal patients with no concern for focal lesion or endometrial pathology 3. D&C/hysteroscopy. 4. Endometrial ablation via Novasure or other techniques for premenopausal patients with no concern for focal lesion or endometrial pathology  5. As final resort, hysterectomy. After consideration of her history and findings, mutual decision has been made to proceed with D+C/hysteroscopy. While  the incidence is low, the risks from this surgery include, but are not limited to, the risks of anesthesia, hemorrhage, infection, perforation, and injury to adjacent structures including bowel, bladder and blood vessels.    2) Routine postoperative instructions were reviewed with the patient and her family in detail today including the expected length of recovery and likely postoperative course.  The patient concurred with the proposed plan, giving informed written consent for the surgery today.  Patient instructed on the importance of being NPO after midnight prior to her procedure.  If warranted preoperative prophylactic antibiotics and SCDs ordered on call to the OR to meet SCIP guidelines and adhere to recommendation laid forth in [place] Number 104 May 2009  "Antibiotic Prophylaxis for Gynecologic Procedures".     Malachy Mood, MD, [facility], [facility] 07/12/2019, 4:13 PM

## 2019-07-20 ENCOUNTER — Encounter
Admission: RE | Admit: 2019-07-20 | Discharge: 2019-07-20 | Disposition: A | Discharge status: Home / Self Care | Payer: BC Managed Care – PPO | Source: Ambulatory Visit | Attending: Obstetrics and Gynecology | Admitting: Obstetrics and Gynecology

## 2019-07-20 ENCOUNTER — Other Ambulatory Visit: Payer: Self-pay

## 2019-07-20 HISTORY — DX: Essential (primary) hypertension: I10

## 2019-07-20 HISTORY — DX: Sleep apnea, unspecified: G47.30

## 2019-07-20 NOTE — Patient Instructions (Signed)
Your procedure is scheduled on: 07/26/19 Report to [facility]. To find out your arrival time please call [phone] between 1PM - 3PM on 07/25/19.  Remember: Instructions that are not followed completely may result in serious medical risk, up to and including death, or upon the discretion of your surgeon and anesthesiologist your surgery may need to be rescheduled.     _X__ 1. Do not eat food after midnight the night before your procedure.                 No gum chewing or hard candies. You may drink clear liquids up to 2 hours                 before you are scheduled to arrive for your surgery- DO not drink clear                 liquids within 2 hours of the start of your surgery.                 Clear Liquids include:  water, apple juice without pulp, clear carbohydrate                 drink such as Clearfast or Gatorade, Black Coffee or Tea (Do not add                 anything to coffee or tea). Diabetics water only  __X__2.  On the morning of surgery brush your teeth with toothpaste and water, you                 may rinse your mouth with mouthwash if you wish.  Do not swallow any              toothpaste of mouthwash.     _X__ 3.  No Alcohol for 24 hours before or after surgery.   _X__ 4.  Do Not Smoke or use e-cigarettes For 24 Hours Prior to Your Surgery.                 Do not use any chewable tobacco products for at least 6 hours prior to                 surgery.  ____  5.  Bring all medications with you on the day of surgery if instructed.   __X__  6.  Notify your doctor if there is any change in your medical condition      (cold, fever, infections).     Do not wear jewelry, make-up, hairpins, clips or nail polish. Do not wear lotions, powders, or perfumes.  Do not shave 48 hours prior to surgery. Men may shave face and neck. Do not bring valuables to the hospital.    [facility] is not responsible for any belongings or  valuables.  Contacts, dentures/partials or body piercings may not be worn into surgery. Bring a case for your contacts, glasses or hearing aids, a denture cup will be supplied. Leave your suitcase in the car. After surgery it may be brought to your room. For patients admitted to the hospital, discharge time is determined by your treatment team.   Patients discharged the day of surgery will not be allowed to drive home.   Please read over the following fact sheets that you were given:   MRSA Information  __X__ Take these medicines the morning of surgery with A SIP OF WATER:  1. NONE  2.   3.   4.  5.  6.  ____ Fleet Enema (as directed)   __ __ Use CHG Soap/SAGE wipes as directed  ____ Use inhalers on the day of surgery  ____ Stop metformin/Janumet/Farxiga 2 days prior to surgery    ____ Take 1/2 of usual insulin dose the night before surgery. No insulin the morning          of surgery.   ____ Stop Blood Thinners Coumadin/Plavix/Xarelto/Pleta/Pradaxa/Eliquis/Effient/Aspirin  on   Or contact your Surgeon, Cardiologist or Medical Doctor regarding  ability to stop your blood thinners  __X__ Stop Anti-inflammatories 7 days before surgery such as Advil, Ibuprofen, Motrin,  BC or Goodies Powder, Naprosyn, Naproxen, Aleve, Aspirin    __X__ Stop all herbal supplements, fish oil or vitamin E until after surgery.    ____ Bring C-Pap to the hospital.    ENSURE PRE SURGERY DRINK SHOULD BE COMPLETED 2 HOURS PRIOR TO YOUR ARRIVAL THE DAY OF YOUR SURGERY  STOP ALL SUPPLEMENTS UNTIL AFTER SURGERY

## 2019-07-22 ENCOUNTER — Other Ambulatory Visit: Payer: Self-pay

## 2019-07-22 ENCOUNTER — Encounter
Admission: RE | Admit: 2019-07-22 | Discharge: 2019-07-22 | Disposition: A | Discharge status: Home / Self Care | Payer: BC Managed Care – PPO | Source: Ambulatory Visit | Attending: Obstetrics and Gynecology | Admitting: Obstetrics and Gynecology

## 2019-07-22 DIAGNOSIS — Z20822 Contact with and (suspected) exposure to covid-19: Secondary | ICD-10-CM | POA: Diagnosis not present

## 2019-07-22 DIAGNOSIS — Z01818 Encounter for other preprocedural examination: Secondary | ICD-10-CM | POA: Insufficient documentation

## 2019-07-22 DIAGNOSIS — I10 Essential (primary) hypertension: Secondary | ICD-10-CM | POA: Diagnosis not present

## 2019-07-22 LAB — BASIC METABOLIC PANEL
Anion gap: 10 (ref 5–15)
BUN: 13 mg/dL (ref 6–20)
CO2: 24 mmol/L (ref 22–32)
Calcium: 8.8 mg/dL — ABNORMAL LOW (ref 8.9–10.3)
Chloride: 104 mmol/L (ref 98–111)
Creatinine, Ser: 0.68 mg/dL (ref 0.44–1.00)
GFR calc Af Amer: >60 mL/min (ref >60)
GFR calc non Af Amer: >60 mL/min (ref >60)
Glucose, Bld: 120 mg/dL — ABNORMAL HIGH (ref 70–99)
Potassium: 3.6 mmol/L (ref 3.5–5.1)
Sodium: 138 mmol/L (ref 135–145)

## 2019-07-22 LAB — CBC
HCT: 42.4 % (ref 36.0–46.0)
Hemoglobin: 14.3 g/dL (ref 12.0–15.0)
MCH: 30.5 pg (ref 26.0–34.0)
MCHC: 33.7 g/dL (ref 30.0–36.0)
MCV: 90.4 fL (ref 80.0–100.0)
Platelets: 418 10*3/uL — ABNORMAL HIGH (ref 150–400)
RBC: 4.69 MIL/uL (ref 3.87–5.11)
RDW: 12 % (ref 11.5–15.5)
WBC: 7.7 10*3/uL (ref 4.0–10.5)
nRBC: 0 % (ref 0.0–0.2)

## 2019-07-22 LAB — SARS CORONAVIRUS 2 (TAT 6-24 HRS): SARS Coronavirus 2: NEGATIVE

## 2019-07-26 ENCOUNTER — Ambulatory Visit: Payer: BC Managed Care – PPO | Admitting: Anesthesiology

## 2019-07-26 ENCOUNTER — Encounter
Admission: RE | Disposition: A | Discharge status: Home / Self Care | Payer: Self-pay | Source: Ambulatory Visit | Attending: Obstetrics and Gynecology

## 2019-07-26 ENCOUNTER — Encounter: Payer: Self-pay | Admitting: Obstetrics and Gynecology

## 2019-07-26 ENCOUNTER — Ambulatory Visit
Admission: RE | Admit: 2019-07-26 | Discharge: 2019-07-26 | Disposition: A | Discharge status: Home / Self Care | Payer: BC Managed Care – PPO | Source: Ambulatory Visit | Attending: Obstetrics and Gynecology | Admitting: Obstetrics and Gynecology

## 2019-07-26 ENCOUNTER — Other Ambulatory Visit: Payer: Self-pay

## 2019-07-26 DIAGNOSIS — Z6841 Body Mass Index (BMI) 40.0 and over, adult: Secondary | ICD-10-CM | POA: Diagnosis not present

## 2019-07-26 DIAGNOSIS — I10 Essential (primary) hypertension: Secondary | ICD-10-CM | POA: Insufficient documentation

## 2019-07-26 DIAGNOSIS — R9389 Abnormal findings on diagnostic imaging of other specified body structures: Secondary | ICD-10-CM

## 2019-07-26 DIAGNOSIS — E669 Obesity, unspecified: Secondary | ICD-10-CM | POA: Insufficient documentation

## 2019-07-26 DIAGNOSIS — Z87891 Personal history of nicotine dependence: Secondary | ICD-10-CM | POA: Insufficient documentation

## 2019-07-26 DIAGNOSIS — Z79899 Other long term (current) drug therapy: Secondary | ICD-10-CM | POA: Diagnosis not present

## 2019-07-26 DIAGNOSIS — F419 Anxiety disorder, unspecified: Secondary | ICD-10-CM | POA: Insufficient documentation

## 2019-07-26 DIAGNOSIS — N939 Abnormal uterine and vaginal bleeding, unspecified: Secondary | ICD-10-CM | POA: Diagnosis not present

## 2019-07-26 DIAGNOSIS — G4730 Sleep apnea, unspecified: Secondary | ICD-10-CM | POA: Insufficient documentation

## 2019-07-26 DIAGNOSIS — N841 Polyp of cervix uteri: Secondary | ICD-10-CM

## 2019-07-26 DIAGNOSIS — N840 Polyp of corpus uteri: Secondary | ICD-10-CM | POA: Diagnosis not present

## 2019-07-26 HISTORY — PX: INTRAUTERINE DEVICE (IUD) INSERTION: SHX5877

## 2019-07-26 HISTORY — PX: HYSTEROSCOPY WITH D & C: SHX1775

## 2019-07-26 LAB — POCT PREGNANCY, URINE: Preg Test, Ur: NEGATIVE

## 2019-07-26 SURGERY — DILATATION AND CURETTAGE /HYSTEROSCOPY
Anesthesia: General | Site: Uterus

## 2019-07-26 MED ORDER — KETOROLAC TROMETHAMINE 30 MG/ML IJ SOLN
INTRAMUSCULAR | Status: DC | PRN
  Administered 2019-07-26: 30 mg via INTRAVENOUS

## 2019-07-26 MED ORDER — IBUPROFEN 600 MG PO TABS
600.0000 mg | ORAL_TABLET | Freq: Four times a day (QID) | ORAL | 3 refills | Status: AC | PRN
Start: 2019-07-26 — End: ?

## 2019-07-26 MED ORDER — MIDAZOLAM HCL 2 MG/2ML IJ SOLN
INTRAMUSCULAR | Status: DC | PRN
  Administered 2019-07-26: 2 mg via INTRAVENOUS

## 2019-07-26 MED ORDER — DEXMEDETOMIDINE HCL IN NACL 80 MCG/20ML IV SOLN
INTRAVENOUS | Status: AC
  Filled 2019-07-26: qty 20

## 2019-07-26 MED ORDER — EPHEDRINE SULFATE 50 MG/ML IJ SOLN
INTRAMUSCULAR | Status: DC | PRN
  Administered 2019-07-26: 5 mg via INTRAVENOUS
  Administered 2019-07-26: 10 mg via INTRAVENOUS

## 2019-07-26 MED ORDER — DEXAMETHASONE SODIUM PHOSPHATE 10 MG/ML IJ SOLN
INTRAMUSCULAR | Status: DC | PRN
  Administered 2019-07-26: 10 mg via INTRAVENOUS

## 2019-07-26 MED ORDER — FENTANYL CITRATE (PF) 100 MCG/2ML IJ SOLN: 25.0000 ug | INTRAMUSCULAR | Status: DC | PRN

## 2019-07-26 MED ORDER — OXYCODONE HCL 5 MG PO TABS: 5.0000 mg | ORAL_TABLET | Freq: Once | ORAL | Status: DC | PRN

## 2019-07-26 MED ORDER — PROPOFOL 10 MG/ML IV BOLUS
INTRAVENOUS | Status: AC
  Filled 2019-07-26: qty 20

## 2019-07-26 MED ORDER — OXYCODONE HCL 5 MG/5ML PO SOLN: 5.0000 mg | Freq: Once | ORAL | Status: DC | PRN

## 2019-07-26 MED ORDER — DEXAMETHASONE SODIUM PHOSPHATE 10 MG/ML IJ SOLN
INTRAMUSCULAR | Status: AC
  Filled 2019-07-26: qty 1

## 2019-07-26 MED ORDER — DEXMEDETOMIDINE HCL 200 MCG/2ML IV SOLN
INTRAVENOUS | Status: DC | PRN
  Administered 2019-07-26 (×2): 4 ug via INTRAVENOUS

## 2019-07-26 MED ORDER — GLYCOPYRROLATE 0.2 MG/ML IJ SOLN
INTRAMUSCULAR | Status: DC | PRN
  Administered 2019-07-26: .2 mg via INTRAVENOUS

## 2019-07-26 MED ORDER — SUGAMMADEX SODIUM 500 MG/5ML IV SOLN
INTRAVENOUS | Status: AC
  Filled 2019-07-26: qty 5

## 2019-07-26 MED ORDER — LEVONORGESTREL 20 MCG/24HR IU IUD
INTRAUTERINE_SYSTEM | INTRAUTERINE | Status: AC
  Filled 2019-07-26: qty 1

## 2019-07-26 MED ORDER — FENTANYL CITRATE (PF) 100 MCG/2ML IJ SOLN
INTRAMUSCULAR | Status: AC
  Filled 2019-07-26: qty 2

## 2019-07-26 MED ORDER — PROMETHAZINE HCL 25 MG/ML IJ SOLN: 6.2500 mg | INTRAMUSCULAR | Status: DC | PRN

## 2019-07-26 MED ORDER — MIDAZOLAM HCL 2 MG/2ML IJ SOLN
INTRAMUSCULAR | Status: AC
  Filled 2019-07-26: qty 2

## 2019-07-26 MED ORDER — SILVER NITRATE-POT NITRATE 75-25 % EX MISC
CUTANEOUS | Status: AC
  Filled 2019-07-26: qty 10

## 2019-07-26 MED ORDER — SUCCINYLCHOLINE CHLORIDE 20 MG/ML IJ SOLN
INTRAMUSCULAR | Status: DC | PRN
  Administered 2019-07-26: 120 mg via INTRAVENOUS

## 2019-07-26 MED ORDER — FAMOTIDINE 20 MG PO TABS
ORAL_TABLET | ORAL | Status: AC
  Administered 2019-07-26: 20 mg via ORAL
  Filled 2019-07-26: qty 1

## 2019-07-26 MED ORDER — BUPIVACAINE HCL (PF) 0.5 % IJ SOLN
INTRAMUSCULAR | Status: AC
  Filled 2019-07-26: qty 30

## 2019-07-26 MED ORDER — KETOROLAC TROMETHAMINE 30 MG/ML IJ SOLN
INTRAMUSCULAR | Status: AC
  Filled 2019-07-26: qty 1

## 2019-07-26 MED ORDER — SUCCINYLCHOLINE CHLORIDE 200 MG/10ML IV SOSY
PREFILLED_SYRINGE | INTRAVENOUS | Status: AC
  Filled 2019-07-26: qty 10

## 2019-07-26 MED ORDER — LACTATED RINGERS IV SOLN: INTRAVENOUS | Status: DC

## 2019-07-26 MED ORDER — HYDROCODONE-ACETAMINOPHEN 5-325 MG PO TABS: 1.0000 | ORAL_TABLET | Freq: Four times a day (QID) | ORAL | 0 refills | Status: DC | PRN

## 2019-07-26 MED ORDER — MEPERIDINE HCL 50 MG/ML IJ SOLN: 6.2500 mg | INTRAMUSCULAR | Status: DC | PRN

## 2019-07-26 MED ORDER — ONDANSETRON HCL 4 MG/2ML IJ SOLN
INTRAMUSCULAR | Status: DC | PRN
  Administered 2019-07-26: 4 mg via INTRAVENOUS

## 2019-07-26 MED ORDER — ONDANSETRON HCL 4 MG/2ML IJ SOLN
INTRAMUSCULAR | Status: AC
  Filled 2019-07-26: qty 2

## 2019-07-26 MED ORDER — FAMOTIDINE 20 MG PO TABS: 20.0000 mg | ORAL_TABLET | Freq: Once | ORAL | Status: AC

## 2019-07-26 MED ORDER — FENTANYL CITRATE (PF) 100 MCG/2ML IJ SOLN
INTRAMUSCULAR | Status: DC | PRN
  Administered 2019-07-26 (×2): 50 ug via INTRAVENOUS

## 2019-07-26 MED ORDER — PROPOFOL 10 MG/ML IV BOLUS
INTRAVENOUS | Status: DC | PRN
  Administered 2019-07-26: 200 mg via INTRAVENOUS
  Administered 2019-07-26: 70 mg via INTRAVENOUS
  Administered 2019-07-26: 100 mg via INTRAVENOUS

## 2019-07-26 MED ORDER — SODIUM CHLORIDE 0.9 % IR SOLN
Status: DC | PRN
Start: 2019-07-26 — End: 2019-07-26
  Administered 2019-07-26: 1

## 2019-07-26 MED ORDER — EPHEDRINE 5 MG/ML INJ
INTRAVENOUS | Status: AC
  Filled 2019-07-26: qty 10

## 2019-07-26 MED ORDER — LEVONORGESTREL 20 MCG/24HR IU IUD: INTRAUTERINE_SYSTEM | INTRAUTERINE | Status: DC

## 2019-07-26 MED ORDER — LIDOCAINE HCL (CARDIAC) PF 100 MG/5ML IV SOSY
PREFILLED_SYRINGE | INTRAVENOUS | Status: DC | PRN
  Administered 2019-07-26: 100 mg via INTRAVENOUS

## 2019-07-26 SURGICAL SUPPLY — 24 items
CATH ROBINSON RED A/P 16FR (CATHETERS) ×3 IMPLANT
COVER WAND RF STERILE (DRAPES) ×3 IMPLANT
DEVICE MYOSURE LITE (MISCELLANEOUS) ×3 IMPLANT
ELECT REM PT RETURN 9FT ADLT (ELECTROSURGICAL)
ELECTRODE REM PT RTRN 9FT ADLT (ELECTROSURGICAL) IMPLANT
GLOVE BIO SURGEON STRL SZ7 (GLOVE) ×3 IMPLANT
GLOVE INDICATOR 7.5 STRL GRN (GLOVE) ×3 IMPLANT
GOWN STRL REUS W/ TWL LRG LVL3 (GOWN DISPOSABLE) ×2 IMPLANT
GOWN STRL REUS W/TWL LRG LVL3 (GOWN DISPOSABLE) ×4
IV LACTATED RINGER IRRG 3000ML (IV SOLUTION)
IV LR IRRIG 3000ML ARTHROMATIC (IV SOLUTION) IMPLANT
IV NS IRRIG 3000ML ARTHROMATIC (IV SOLUTION) ×3 IMPLANT
KIT PROCEDURE FLUENT (KITS) ×3 IMPLANT
KIT TURNOVER CYSTO (KITS) ×3 IMPLANT
MYOSURE XL FIBROID (MISCELLANEOUS)
PACK DNC HYST (MISCELLANEOUS) ×3 IMPLANT
PAD OB MATERNITY 4.3X12.25 (PERSONAL CARE ITEMS) ×3 IMPLANT
PAD PREP 24X41 OB/GYN DISP (PERSONAL CARE ITEMS) ×3 IMPLANT
SEAL ROD LENS SCOPE MYOSURE (ABLATOR) ×3 IMPLANT
SUT VIC AB 0 CT1 36 (SUTURE) ×3 IMPLANT
SYSTEM TISS REMOVAL MYOSURE XL (MISCELLANEOUS) IMPLANT
TOWEL OR 17X26 4PK STRL BLUE (TOWEL DISPOSABLE) ×3 IMPLANT
TUBING CONNECTING 10 (TUBING) ×2 IMPLANT
TUBING CONNECTING 10' (TUBING) ×1

## 2019-07-26 NOTE — H&P (Signed)
Date of Initial H&P: 07/12/2019  History reviewed, patient examined, no change in status, stable for surgery.

## 2019-07-26 NOTE — Anesthesia Procedure Notes (Signed)
Procedure Name: Intubation Date/Time: 07/26/2019 3:29 PM Performed by: Caryl Asp, CRNA Pre-anesthesia Checklist: Patient identified, Patient being monitored, Timeout performed, Emergency Drugs available and Suction available Patient Re-evaluated:Patient Re-evaluated prior to induction Oxygen Delivery Method: Circle system utilized Preoxygenation: Pre-oxygenation with 100% oxygen Induction Type: IV induction Ventilation: Mask ventilation without difficulty and Oral airway inserted - appropriate to patient size Laryngoscope Size: 3 and McGraph Grade View: Grade II Tube type: Oral Tube size: 7.0 mm Number of attempts: 1 Airway Equipment and Method: Stylet Placement Confirmation: ETT inserted through vocal cords under direct vision,  positive ETCO2 and breath sounds checked- equal and bilateral Secured at: 22 cm Tube secured with: Tape Dental Injury: Teeth and Oropharynx as per pre-operative assessment

## 2019-07-26 NOTE — Anesthesia Preprocedure Evaluation (Addendum)
Anesthesia Evaluation  Patient identified by MRN, date of birth, ID band Patient awake    Reviewed: Allergy & Precautions, NPO status , Patient's Chart, lab work & pertinent test results  History of Anesthesia Complications Negative for: history of anesthetic complications  Airway Mallampati: II  TM Distance: >3 FB Neck ROM: Full    Dental no notable dental hx.    Pulmonary Sleep apnea: lost weight and no longer needs CPAP. , neg COPD, former smoker,    breath sounds clear to auscultation- rhonchi (-) wheezing      Cardiovascular Exercise Tolerance: Good hypertension, Pt. on medications (-) CAD, (-) Past MI, (-) Cardiac Stents and (-) CABG  Rhythm:Regular Rate:Normal - Systolic murmurs and - Diastolic murmurs    Neuro/Psych neg Seizures Anxiety negative neurological ROS     GI/Hepatic negative GI ROS, Neg liver ROS,   Endo/Other  negative endocrine ROSneg diabetes  Renal/GU negative Renal ROS     Musculoskeletal negative musculoskeletal ROS (+)   Abdominal (+) + obese,   Peds  Hematology negative hematology ROS (+)   Anesthesia Other Findings Past Medical History: No date: Allergic rhinitis No date: Anxiety No date: Candidal vaginitis No date: History of MRSA infection No date: Hypertension No date: Obesity     Comment:  BMI 41.8 04/17/2017 No date: Sleep apnea     Comment:  LOST WEIGHT STOPPED USING   Reproductive/Obstetrics                            Anesthesia Physical Anesthesia Plan  ASA: II  Anesthesia Plan: General   Post-op Pain Management:    Induction: Intravenous  PONV Risk Score and Plan: 2 and Dexamethasone, Ondansetron and Midazolam  Airway Management Planned: Oral ETT  Additional Equipment:   Intra-op Plan:   Post-operative Plan: Extubation in OR  Informed Consent: I have reviewed the patients History and Physical, chart, labs and discussed the  procedure including the risks, benefits and alternatives for the proposed anesthesia with the patient or authorized representative who has indicated his/her understanding and acceptance.     Dental advisory given  Plan Discussed with: CRNA and Anesthesiologist  Anesthesia Plan Comments:         Anesthesia Quick Evaluation

## 2019-07-26 NOTE — Transfer of Care (Signed)
Immediate Anesthesia Transfer of Care Note  Patient: Leslie Ross  Procedure(s) Performed: DILATATION AND CURETTAGE /HYSTEROSCOPY (N/A Uterus) INTRAUTERINE DEVICE (IUD) INSERTION - Mirena (N/A Uterus)  Patient Location: PACU  Anesthesia Type:General  Level of Consciousness: awake, alert  and oriented  Airway & Oxygen Therapy: Patient Spontanous Breathing and Patient connected to face mask oxygen  Post-op Assessment: Report given to RN and Post -op Vital signs reviewed and stable  Post vital signs: Reviewed and stable  Last Vitals:  Vitals Value Taken Time  BP 137/75 07/26/19 1640  Temp    Pulse 101 07/26/19 1644  Resp 15 07/26/19 1644  SpO2 96 % 07/26/19 1644  Vitals shown include unvalidated device data.  Last Pain:  Vitals:   07/26/19 1640  TempSrc:   PainSc: 0-No pain         Complications: No apparent anesthesia complications

## 2019-07-26 NOTE — Op Note (Signed)
Preoperative Diagnosis: 1) 54 y.o. with with abnormal uterine bleeding  Postoperative Diagnosis: 1) 54 y.o. with abnormal uterine bleeding  Operation Performed: Hysteroscopy, targeted dilation and curettage using MySure device with resection of cervical polyp  Indication: Abnormal uterine bleeding, thickened endometrium  Anesthesia: .Choice  Primary Surgeon: Malachy Mood, MD  Assistant: none  Preoperative Antibiotics: none  Estimated Blood Loss: 10 mL  Urine Output:: ~129mL straight cath  Drains or Tubes: none  Implants: none  Specimens Removed: endometrial curettings, lower uterine segment/cervical polyp  Complications: none  Intraoperative Findings:  Cervix with acute angle likely secondary to uterine fibroid.  Small polyp at the junction of lower uterine septum internal cervical os.  Cavity with prominent cornua suggestive of uterine septum.  Endometrium thin without focal abnormalities.    Patient Condition: stable  Procedure in Detail:  Patient was taken to the operating room were she was administered general endotracheal anesthesia.  She was positioned in the dorsal lithotomy position utilizing Allen stirups, prepped and draped in the usual sterile fashion.  Uterus was noted to be 10 week size, anteverted.   Prior to proceeding with the case a time out was performed.  Attention was turned to the patient's pelvis.  A red rubber catheter was used to empty the patient's bladder.  An operative speculum was placed to allow visualization of the cervix.  The anterior lip of the cervix was grasped with a single tooth tenaculum and the cervix was sequentially dilated using pratt dilators.  The hysteroscope was then advanced into the cervix but given the acute angle at the internal cervical os and cervical length was unable to be advanced into the uterine cavity.  The speculum was removed to allow additional reach of the hysteroscope.  A light Myosure was advanced and the cervical  polyp was resected.  The Myosure was advanced into the uterine cavity and used as a guide wire to advance to scope which was successful in gaining entry into the endometrial cavity.  The cavity appeared to have a uterine septum.  Given acute angle of cervix and uterine septum decision made to abort planned Mirena IUD insertion.   A four quadrant curettage was performed using the Myosure with the resulting specimen collected and sent to pathology.    The single tooth tenaculum was removed from the cervix.  The tenaculum sites and cervix were noted to not be  Hemostatic.  Two figure of 8 sutures of 0 Vicryl were placed over the tenaculum sites. Silver nitrate applied.  Following this the tenaculum sites were noted to have achieved hemostasis before removing the operative speculum.  Sponge needle and instrument counts were corrects times two.  The patient tolerated the procedure well and was taken to the recovery room in stable condition.

## 2019-07-26 NOTE — Discharge Instructions (Signed)

## 2019-07-26 NOTE — Anesthesia Postprocedure Evaluation (Signed)
Anesthesia Post Note  Patient: Leslie Ross  Procedure(s) Performed: DILATATION AND CURETTAGE /HYSTEROSCOPY (N/A Uterus) INTRAUTERINE DEVICE (IUD) INSERTION - Mirena (N/A Uterus)  Patient location during evaluation: PACU Anesthesia Type: General Level of consciousness: awake and alert Pain management: pain level controlled Vital Signs Assessment: post-procedure vital signs reviewed and stable Respiratory status: spontaneous breathing, nonlabored ventilation, respiratory function stable and patient connected to nasal cannula oxygen Cardiovascular status: blood pressure returned to baseline and stable Postop Assessment: no apparent nausea or vomiting Anesthetic complications: no     Last Vitals:  Vitals:   07/26/19 1655 07/26/19 1704  BP: 132/73 (!) 142/83  Pulse: 87 89  Resp: 13   Temp:    SpO2: 95% 97%    Last Pain:  Vitals:   07/26/19 1704  TempSrc:   PainSc: 0-No pain                 Arita Miss

## 2019-07-28 LAB — SURGICAL PATHOLOGY

## 2019-08-09 DIAGNOSIS — H6501 Acute serous otitis media, right ear: Secondary | ICD-10-CM | POA: Diagnosis not present

## 2019-08-09 DIAGNOSIS — H6121 Impacted cerumen, right ear: Secondary | ICD-10-CM | POA: Diagnosis not present

## 2019-08-24 DIAGNOSIS — H6981 Other specified disorders of Eustachian tube, right ear: Secondary | ICD-10-CM | POA: Diagnosis not present

## 2019-08-24 DIAGNOSIS — H6500 Acute serous otitis media, unspecified ear: Secondary | ICD-10-CM | POA: Diagnosis not present

## 2019-09-09 ENCOUNTER — Other Ambulatory Visit: Payer: Self-pay | Admitting: Certified Nurse Midwife

## 2019-09-09 DIAGNOSIS — N83202 Unspecified ovarian cyst, left side: Secondary | ICD-10-CM

## 2019-09-20 ENCOUNTER — Other Ambulatory Visit: Payer: Self-pay

## 2019-09-20 ENCOUNTER — Ambulatory Visit
Admission: RE | Admit: 2019-09-20 | Discharge: 2019-09-20 | Disposition: A | Discharge status: Home / Self Care | Payer: BC Managed Care – PPO | Source: Ambulatory Visit | Attending: Certified Nurse Midwife | Admitting: Certified Nurse Midwife

## 2019-09-20 ENCOUNTER — Encounter: Payer: Self-pay | Admitting: Certified Nurse Midwife

## 2019-09-20 ENCOUNTER — Ambulatory Visit (INDEPENDENT_AMBULATORY_CARE_PROVIDER_SITE_OTHER): Payer: BC Managed Care – PPO | Admitting: Certified Nurse Midwife

## 2019-09-20 ENCOUNTER — Ambulatory Visit (INDEPENDENT_AMBULATORY_CARE_PROVIDER_SITE_OTHER): Payer: BC Managed Care – PPO

## 2019-09-20 VITALS — BP 116/72 | HR 72 | Resp 18 | Ht 67.0 in | Wt 276.6 lb

## 2019-09-20 DIAGNOSIS — Z1231 Encounter for screening mammogram for malignant neoplasm of breast: Secondary | ICD-10-CM | POA: Insufficient documentation

## 2019-09-20 DIAGNOSIS — N83202 Unspecified ovarian cyst, left side: Secondary | ICD-10-CM

## 2019-09-20 NOTE — Progress Notes (Addendum)
  HPI: 54 year old G1 P2, LMP 4 June 21, who presents after a pelvic ultrasound to follow up on some ovarian cysts that were noted on ultrasound that was done as part of a work up for AUB in February 2021. She has since had a hysteroscopy and D&C. The ultrasound in February revealed the following:  Right Ovary measures 3.3 x 4.6 x 3.4 cm. There is a simple cyst in the right ovary measuring 32.0 x 27.8 x 26.6 mm  Left Ovary measures 4.9 x 4.9 x 2.9 cm. It is not normal in appearance. There are two simple cysts in the left ovary. The smaller one is a daughter cyst within the larger one. The cysts measure 42.3 x 24.9 x 48.0 mm and 20.3 x 16.2 x 22.9 mm.   Today's ultrasound is normal and there has been resolution of the ovarian cysts. She continues on norethindrone 0.35 mgm. Has not had any heavy bleeding since her surgery, but has had some irregular bleeding on the minipill.Marland Kitchen    PMHx: She  has a past medical history of Allergic rhinitis, Anxiety, Candidal vaginitis, History of MRSA infection, Hypertension, Obesity, and Sleep apnea. Also,  has a past surgical history that includes Cesarean section (12/1997); Tonsillectomy; Colonoscopy with propofol (N/A, 06/30/2017); Hysteroscopy with D & C (N/A, 07/26/2019); and Intrauterine device (iud) insertion (N/A, 07/26/2019)., family history includes Aneurysm in her father; Dementia (age of onset: 40) in her mother; Diabetes in her maternal grandmother; Heart disease in her father, maternal grandmother, and paternal grandmother.,  reports that she has quit smoking. She has never used smokeless tobacco. She reports current alcohol use. She reports that she does not use drugs.  She has a current medication list which includes the following prescription(s): b complex vitamins, calcium carb-cholecalciferol, cetirizine, collagen hydrolysate (bovine), fluticasone, garlic, hydrochlorothiazide, hydrocodone-acetaminophen, ibuprofen, magnesium oxide, multivitamin with minerals,  norethindrone, omega 3 500, OVER THE COUNTER MEDICATION, sertraline, and turmeric. Also, has No Known Allergies.  ROS  Objective: BP 116/72   Pulse 72   Resp 18   Ht 5\' 7"  (1.702 m)   Wt 276 lb 9.6 oz (125.5 kg)   LMP 09/02/2019   SpO2 98%   BMI 43.32 kg/m   Physical examination Constitutional NAD, Conversant        Neuro: Grossly intact  Psych: Oriented to PPT.  Normal mood. Normal affect.   Assessment:  Resolution of ovarian cysts  Plan: RTO for annual and prn.  Dalia Heading, CNM

## 2019-12-09 ENCOUNTER — Other Ambulatory Visit: Payer: Self-pay | Admitting: Family Medicine

## 2019-12-09 DIAGNOSIS — I10 Essential (primary) hypertension: Secondary | ICD-10-CM

## 2019-12-09 NOTE — Telephone Encounter (Signed)
Requested Prescriptions  Pending Prescriptions Disp Refills   hydrochlorothiazide (HYDRODIURIL) 12.5 MG tablet [Pharmacy Med Name: HYDROCHLOROTHIAZIDE 12.5 MG TAB] 90 tablet 0    Sig: TAKE 1 TABLET BY MOUTH DAILY     Cardiovascular: Diuretics - Thiazide Failed - 12/09/2019 12:01 PM      Failed - Ca in normal range and within 360 days    Calcium  Date Value Ref Range Status  07/22/2019 8.8 (L) 8.9 - 10.3 mg/dL Final         Failed - Valid encounter within last 6 months    Recent Outpatient Visits          1 year ago Essential hypertension   [place], DO      Future Appointments            In 5 days [facility], Devonne Doughty, DO [facility] [place] in normal range and within 360 days    Creatinine, Ser  Date Value Ref Range Status  07/22/2019 0.68 0.44 - 1.00 mg/dL Final         Passed - K in normal range and within 360 days    Potassium  Date Value Ref Range Status  07/22/2019 3.6 3.5 - 5.1 mmol/L Final         Passed - Na in normal range and within 360 days    Sodium  Date Value Ref Range Status  07/22/2019 138 135 - 145 mmol/L Final  05/14/2018 138 134 - 144 mmol/L Final         Passed - Last BP in normal range    BP Readings from Last 1 Encounters:  09/20/19 116/72         Called and scheduled OV for patient. Last OV 07/13/18. 30 day courtesy refill given.

## 2019-12-14 ENCOUNTER — Ambulatory Visit: Payer: BC Managed Care – PPO | Admitting: Family Medicine

## 2019-12-14 ENCOUNTER — Other Ambulatory Visit: Payer: Self-pay

## 2019-12-14 ENCOUNTER — Encounter: Payer: Self-pay | Admitting: Family Medicine

## 2019-12-14 VITALS — BP 131/76 | HR 63 | Temp 97.3°F | Resp 16 | Ht 67.0 in | Wt 280.6 lb

## 2019-12-14 DIAGNOSIS — I10 Essential (primary) hypertension: Secondary | ICD-10-CM | POA: Diagnosis not present

## 2019-12-14 DIAGNOSIS — Z6841 Body Mass Index (BMI) 40.0 and over, adult: Secondary | ICD-10-CM

## 2019-12-14 MED ORDER — HYDROCHLOROTHIAZIDE 12.5 MG PO TABS: 12.5000 mg | ORAL_TABLET | Freq: Every day | ORAL | 3 refills | Status: DC

## 2019-12-14 NOTE — Addendum Note (Signed)
Addended by: Olin Hauser on: 12/14/2019 01:10 PM   Modules accepted: Level of Service

## 2019-12-14 NOTE — Patient Instructions (Addendum)
Thank you for coming to the office today.  Refilled HCTZ 12.5mg  daily - 90 days, [facility].  In future if/when ready for blood work let me know in advance.   Please schedule a Follow-up Appointment to: Return in about 6 months (around 06/12/2020) for 6 month follow-up HTN, (fasting AM apt, can get labs AFTER if need).  If you have any other questions or concerns, please feel free to call the office or send a message through [place]. You may also schedule an earlier appointment if necessary.  Additionally, you may be receiving a survey about your experience at our office within a few days to 1 week by e-mail or mail. We value your feedback.  Nobie Putnam, DO [place]

## 2019-12-14 NOTE — Progress Notes (Signed)
Subjective:    Patient ID: Leslie Ross, female    DOB: 1965/05/23, 54 y.o.   MRN: [id number]  Leslie Ross is a 54 y.o. female presenting on 12/14/2019 for Hypertension   HPI   CHRONIC HTN Morbid Obesity BMI >43  History of elevated BP for months to years >140/90s. In past >10+ year ago was treated with medication for HTN, believes it was a fluid pill. Has been off med for many years. Now sees OBGYN routinely asked to come here for new PCP due to HTN. Reports checks home BP occasionally, has wrist cuff, reading today BP 175/76 left arm, seems to be variable, but on average reporting usually 140s/90s.  She plans to get an arm BP cuff instead of wrist. Current Meds - HCTZ 12.5mg  - Taking supplement [place] Lifestyle: - Diet: reducing sodium salt in diet,  - Exercise: gym 4 x week, cardio weight training Admits occasional edema in feet/ankles at times, worse if prolong sitting Denies CP, dyspnea, HA, edema, dizziness / lightheadedness  Irregular Menstrual Cycle Followed by Dalia Heading CNM [facility] On OCP, she is not in menopause at this time, she has some peri-menopausal symptoms, including temperature instability.  Health Maintenance: She does annual physical with GYN Up to date colonoscopy on file in chart. Next due 06/2027. (last done 06/2017, good for 10 years)  Due for Flu Shot, declines today despite counseling on benefits  Not up to date on COVID19 vaccine.  Depression screen [facility] 2/9 12/14/2019 07/13/2018  Decreased Interest 0 0  Down, Depressed, Hopeless 0 0  PHQ - 2 Score 0 0   GAD 7 : Generalized Anxiety Score 07/13/2018 04/17/2017  Nervous, Anxious, on Edge 2 2  Control/stop worrying 2 1  Worry too much - different things 2 1  Trouble relaxing 2 1  Restless 1 0  Easily annoyed or irritable 2 1  Afraid - awful might happen 3 1  Total GAD 7 Score 14 7  Anxiety Difficulty Not difficult at all Somewhat difficult     Social History    Tobacco Use  . Smoking status: Former Research scientist (life sciences)  . Smokeless tobacco: Never Used  . Tobacco comment: quit smoking in 2009  Vaping Use  . Vaping Use: Never used  Substance Use Topics  . Alcohol use: Yes    Comment: occasionally  . Drug use: No    Review of Systems Per HPI unless specifically indicated above     Objective:    BP 131/76   Pulse 63   Temp (!) 97.3 F (36.3 C) (Temporal)   Resp 16   Ht 5\' 7"  (1.702 m)   Wt 280 lb 9.6 oz (127.3 kg)   SpO2 99%   BMI 43.95 kg/m   Wt Readings from Last 3 Encounters:  12/14/19 280 lb 9.6 oz (127.3 kg)  09/20/19 276 lb 9.6 oz (125.5 kg)  07/26/19 274 lb (124.3 kg)    Physical Exam Vitals and nursing note reviewed.  Constitutional:      General: She is not in acute distress.    Appearance: She is well-developed. She is obese. She is not diaphoretic.     Comments: Well-appearing, comfortable, cooperative  HENT:     Head: Normocephalic and atraumatic.  Eyes:     General:        Right eye: No discharge.        Left eye: No discharge.     Conjunctiva/sclera: Conjunctivae normal.  Neck:     Thyroid:  No thyromegaly.  Cardiovascular:     Rate and Rhythm: Normal rate and regular rhythm.     Heart sounds: Normal heart sounds. No murmur heard.   Pulmonary:     Effort: Pulmonary effort is normal. No respiratory distress.     Breath sounds: Normal breath sounds. No wheezing or rales.  Musculoskeletal:        General: Normal range of motion.     Cervical back: Normal range of motion and neck supple.     Right lower leg: No edema.     Left lower leg: No edema.  Lymphadenopathy:     Cervical: No cervical adenopathy.  Skin:    General: Skin is warm and dry.     Findings: No erythema or rash.  Neurological:     Mental Status: She is alert and oriented to person, place, and time.  Psychiatric:        Behavior: Behavior normal.     Comments: Well groomed, good eye contact, normal speech and thoughts        Results for  orders placed or performed during the hospital encounter of 07/26/19  Pregnancy, urine POC  Result Value Ref Range   Preg Test, Ur NEGATIVE NEGATIVE  Surgical pathology  Result Value Ref Range   SURGICAL PATHOLOGY      SURGICAL PATHOLOGY CASE: [id number] PATIENT: [facility] Surgical Pathology Report     Specimen Submitted: A. Endometrial polyps and curettings  Clinical History: AUB N93.9, thickened endometrium R93.89    DIAGNOSIS: A. ENDOMETRIUM; CURETTAGE: - FRAGMENTS OF BENIGN ENDOMETRIAL POLYP. - BACKGROUND DISORDERED PROLIFERATIVE ENDOMETRIUM. - FRAGMENTS OF BENIGN ENDOCERVICAL GLANDULAR TISSUE. - NEGATIVE FOR ATYPICAL HYPERPLASIA/EIN, DYSPLASIA, AND MALIGNANCY.  GROSS DESCRIPTION: A. Labeled: Endometrial polyps and endometrial curettings Received: Formalin Tissue fragment(s): Multiple Size: Aggregate, 2.5 x 2.0 x 0.3 cm Description: Received are minimal, irregular fragments of tan soft tissue admixed with mucinous material.  Filtered into a mesh bag. Entirely submitted in cassette 1.    Final Diagnosis performed by Allena Napoleon, MD.   Electronically signed 07/28/2019 9:40:07AM The electronic signature indicates that the named Attending Pathologist has ev aluated the specimen Technical component performed at [facility], [address] [place], [place] [address] Lab: [phone] Dir: Rush Farmer, MD, MMM  Professional component performed at [facility], [facility], [place], [place],  [address] Lab: [phone] Dir: Dellia Nims. Rubinas, MD       Assessment & Plan:   Problem List Items Addressed This Visit    Morbid obesity with BMI of 40.0-44.9, adult ([place])   Essential hypertension - Primary    Improved HTN control on thiazide Outside BP reading reviewed No known complications Morbid obesity On OCP per GYN   Plan:  1. Continue/refill HCTZ 12.5mg  daily - for BP and edema 2. Encourage improved lifestyle - low sodium  diet, regular exercise 3. monitor BP outside office, with new cuff for arm, bring readings to next visit, if persistently >140/90 or new symptoms notify office sooner  F/u 6 months can check labs if not done by GYN, will need chemistry      Relevant Medications   hydrochlorothiazide (HYDRODIURIL) 12.5 MG tablet      No orders of the defined types were placed in this encounter.    Meds ordered this encounter  Medications  . hydrochlorothiazide (HYDRODIURIL) 12.5 MG tablet    Sig: Take 1 tablet (12.5 mg total) by mouth daily.    Dispense:  90 tablet    Refill:  3    Add refills on file, change to 90 day when patient is ready      Follow up plan: Return in about 6 months (around 06/12/2020) for 6 month follow-up HTN, Weight, (fasting AM apt, can get labs AFTER if need).   Nobie Putnam, [facility] 12/14/2019, 9:41 AM

## 2019-12-14 NOTE — Assessment & Plan Note (Addendum)
Improved HTN control on thiazide Outside BP reading reviewed No known complications Morbid obesity On OCP per GYN   Plan:  1. Continue/refill HCTZ 12.5mg  daily - for BP and edema 2. Encourage improved lifestyle - low sodium diet, regular exercise 3. monitor BP outside office, with new cuff for arm, bring readings to next visit, if persistently >140/90 or new symptoms notify office sooner  F/u 6 months can check labs if not done by GYN, will need chemistry

## 2020-02-02 ENCOUNTER — Other Ambulatory Visit: Payer: Self-pay

## 2020-02-02 ENCOUNTER — Ambulatory Visit (INDEPENDENT_AMBULATORY_CARE_PROVIDER_SITE_OTHER): Payer: BC Managed Care – PPO | Admitting: Family Medicine

## 2020-02-02 ENCOUNTER — Encounter: Payer: Self-pay | Admitting: Family Medicine

## 2020-02-02 DIAGNOSIS — R059 Cough, unspecified: Secondary | ICD-10-CM

## 2020-02-02 DIAGNOSIS — J019 Acute sinusitis, unspecified: Secondary | ICD-10-CM

## 2020-02-02 MED ORDER — AMOXICILLIN-POT CLAVULANATE 875-125 MG PO TABS: 1.0000 | ORAL_TABLET | Freq: Two times a day (BID) | ORAL | 0 refills | Status: DC

## 2020-02-02 MED ORDER — BENZONATATE 100 MG PO CAPS: 100.0000 mg | ORAL_CAPSULE | Freq: Two times a day (BID) | ORAL | 0 refills | Status: DC | PRN

## 2020-02-02 MED ORDER — PROMETHAZINE-DM 6.25-15 MG/5ML PO SYRP: 5.0000 mL | ORAL_SOLUTION | Freq: Four times a day (QID) | ORAL | 0 refills | Status: DC | PRN

## 2020-02-02 NOTE — Assessment & Plan Note (Signed)
Likely sinusitis based on symptoms and history of chronic sinus infections in the past.  Encouraged COVID testing due to illness within the home with similar symptoms.  Will treat with augmentin 875-125mg  BID x 10 days, azelastine nasal spray and promethazine DM for cough.  Discussed OTC coriciden HBP for daytime cough and cold symptoms.  Strict ER precautions.  Plan: 1. Begin augmentin 875-125mg  BID x 10 days 2. Begin azelastine nasal spray as directed 3. Can take promethazine DM 53mL every 6 hours as needed for cough.  Discussed not to drive or operate heavy machinery while taking this medication as it can cause sedation 4. Strict ER precautions 5. RTC PRN

## 2020-02-02 NOTE — Assessment & Plan Note (Signed)
Likely secondary to PND.  See sinusitis A/P

## 2020-02-02 NOTE — Patient Instructions (Signed)
As we discussed, I have sent in a few prescriptions to your pharmacy on file.  Begin augmentin 875-125mg  1 tablet 2x per day for the next 10 days for your presumed sinus infection  Begin azelastine nasal spray as directed  Can take promethazine DM 34mL every 6 hours as needed for cough.  Do not drive or operate heavy machinery while taking this medication as it can cause sedation  If you begin to have worsening shortness of breath, chest pain, fever over 104 that is not responsive to ibuprofen and/or acetaminophen, or impending sense of doom to [place]!  It is encouraged that you have COVID testing completed.  We will plan to see you back if your symptoms worsen or fail to improve  You will receive a survey after today's visit either digitally by e-mail or paper by USPS mail. Your experiences and feedback matter to [place].  Please respond so we know how we are doing as we provide care for you.  Call us with any questions/concerns/needs.  It is my goal to be available to you for your health concerns.  Thanks for choosing me to be a partner in your healthcare needs!  Harlin Rain, FNP-C Family Nurse Practitioner [facility] Phone: [phone]

## 2020-02-02 NOTE — Progress Notes (Signed)
Virtual Visit via Telephone  The purpose of this virtual visit is to provide medical care while limiting exposure to the novel coronavirus (COVID19) for both patient and office staff.  Consent was obtained for phone visit:  Yes.   Answered questions that patient had about telehealth interaction:  Yes.   I discussed the limitations, risks, security and privacy concerns of performing an evaluation and management service by telephone. I also discussed with the patient that there may be a patient responsible charge related to this service. The patient expressed understanding and agreed to proceed.  Patient is at home and is accessed via telephone Services are provided by Harlin Rain, FNP-C from [facility])  ---------------------------------------------------------------------- Chief Complaint  Patient presents with  . Cough    post nasal drainage, nasal draiange w/ yellowish mucus  x 1 week     S: Reviewed CMA documentation. I have called patient and gathered additional HPI as follows:  Ms. Leslie Ross presents for virtual telemedicine visit via telephone for concerns of cough with post nasal drainage and yellow nasal drainage x 1 week.  Reports she had something similar last year that had turned into bronchitis and is concerned about this.  She reports getting about 2-3 hours of sleep per night due to coughing.  Has had some soreness in her throat, but resolves when she drinks fluids.  Denies fever, change in taste/smell, SOB, DOE, CP, abdominal pain, n/v/d.  Her husband and daughter are both in the home with her and they all have similar symptoms.  Reports history of chronic sinus infections in the past.  Last episode of bronchitis 05/2018, denies history of asthma or COPD.  Denies COVID testing or vaccination for COVID.  Patient is currently home Denies any high risk travel to areas of current concern for COVID19. Denies any known or suspected exposure to person  with or possibly with COVID19.  Past Medical History:  Diagnosis Date  . Allergic rhinitis   . Anxiety   . Candidal vaginitis   . History of MRSA infection   . Hypertension   . Obesity    BMI 41.8 04/17/2017  . Sleep apnea    LOST WEIGHT STOPPED USING   Social History   Tobacco Use  . Smoking status: Former Research scientist (life sciences)  . Smokeless tobacco: Never Used  . Tobacco comment: quit smoking in 2009  Vaping Use  . Vaping Use: Never used  Substance Use Topics  . Alcohol use: Yes    Comment: occasionally  . Drug use: No    Current Outpatient Medications:  .  b complex vitamins capsule, Take 1 capsule by mouth daily., Disp: , Rfl:  .  Calcium Carb-Cholecalciferol (CALCIUM 600+D3 PO), Take 1 tablet by mouth daily., Disp: , Rfl:  .  cetirizine (ZYRTEC) 10 MG tablet, Take 10 mg by mouth at bedtime. , Disp: , Rfl:  .  Collagen Hydrolysate, Bovine, POWD, Take 1 Scoop by mouth daily. WITH COFFEE, Disp: , Rfl:  .  fluticasone (FLONASE) 50 MCG/ACT nasal spray, Place 1 spray into both nostrils at bedtime., Disp: , Rfl:  .  Garlic (GARLIQUE) 315 MG TBEC, Take 1 tablet by mouth daily., Disp: , Rfl:  .  hydrochlorothiazide (HYDRODIURIL) 12.5 MG tablet, Take 1 tablet (12.5 mg total) by mouth daily., Disp: 90 tablet, Rfl: 3 .  ibuprofen (ADVIL) 600 MG tablet, Take 1 tablet (600 mg total) by mouth every 6 (six) hours as needed., Disp: 60 tablet, Rfl: 3 .  magnesium oxide (  MAG-OX) 400 MG tablet, Take 400 mg by mouth at bedtime. , Disp: , Rfl:  .  Multiple Vitamin (MULTIVITAMIN WITH MINERALS) TABS tablet, Take 1 tablet by mouth daily., Disp: , Rfl:  .  norethindrone (MICRONOR) 0.35 MG tablet, TAKE 1 TABLET BY MOUTH DAILY, Disp: 84 tablet, Rfl: 2 .  Omega-3 Fatty Acids (OMEGA 3 500) 500 MG CAPS, Take 500 mg by mouth daily., Disp: , Rfl:  .  OVER THE COUNTER MEDICATION, Take 500 mg by mouth daily. [place] Be[place], Disp: , Rfl:  .  sertraline (ZOLOFT) 50 MG tablet, TAKE ONE TABLET EVERY DAY (Patient taking  differently: Take 50 mg by mouth at bedtime. TAKE ONE TABLET EVERY DAY), Disp: 30 tablet, Rfl: 11 .  TURMERIC PO, Take 1,000 mg by mouth daily., Disp: , Rfl:  .  amoxicillin-clavulanate (AUGMENTIN) 875-125 MG tablet, Take 1 tablet by mouth 2 (two) times daily., Disp: 20 tablet, Rfl: 0 .  benzonatate (TESSALON) 100 MG capsule, Take 1 capsule (100 mg total) by mouth 2 (two) times daily as needed for cough., Disp: 20 capsule, Rfl: 0 .  promethazine-dextromethorphan (PROMETHAZINE-DM) 6.25-15 MG/5ML syrup, Take 5 mLs by mouth 4 (four) times daily as needed for cough., Disp: 118 mL, Rfl: 0  Depression screen [facility] 2/9 12/14/2019 07/13/2018  Decreased Interest 0 0  Down, Depressed, Hopeless 0 0  PHQ - 2 Score 0 0    GAD 7 : Generalized Anxiety Score 07/13/2018 04/17/2017  Nervous, Anxious, on Edge 2 2  Control/stop worrying 2 1  Worry too much - different things 2 1  Trouble relaxing 2 1  Restless 1 0  Easily annoyed or irritable 2 1  Afraid - awful might happen 3 1  Total GAD 7 Score 14 7  Anxiety Difficulty Not difficult at all Somewhat difficult    -------------------------------------------------------------------------- O: No physical exam performed due to remote telephone encounter.  Physical Exam: Patient remotely monitored without video.  Verbal communication appropriate.  Cognition normal.  No results found for this or any previous visit (from the past 2160 hour(s)).  -------------------------------------------------------------------------- A&P:  Problem List Items Addressed This Visit      Respiratory   Acute non-recurrent sinusitis - Primary    Likely sinusitis based on symptoms and history of chronic sinus infections in the past.  Encouraged COVID testing due to illness within the home with similar symptoms.  Will treat with augmentin 875-125mg  BID x 10 days, azelastine nasal spray and promethazine DM for cough.  Discussed OTC coriciden HBP for daytime cough and cold symptoms.   Strict ER precautions.  Plan: 1. Begin augmentin 875-125mg  BID x 10 days 2. Begin azelastine nasal spray as directed 3. Can take promethazine DM 58mL every 6 hours as needed for cough.  Discussed not to drive or operate heavy machinery while taking this medication as it can cause sedation 4. Strict ER precautions 5. RTC PRN      Relevant Medications   amoxicillin-clavulanate (AUGMENTIN) 875-125 MG tablet   benzonatate (TESSALON) 100 MG capsule   promethazine-dextromethorphan (PROMETHAZINE-DM) 6.25-15 MG/5ML syrup     Other   Cough    Likely secondary to PND.  See sinusitis A/P      Relevant Medications   benzonatate (TESSALON) 100 MG capsule   promethazine-dextromethorphan (PROMETHAZINE-DM) 6.25-15 MG/5ML syrup      Meds ordered this encounter  Medications  . amoxicillin-clavulanate (AUGMENTIN) 875-125 MG tablet    Sig: Take 1 tablet by mouth 2 (two) times daily.    Dispense:  20 tablet    Refill:  0  . benzonatate (TESSALON) 100 MG capsule    Sig: Take 1 capsule (100 mg total) by mouth 2 (two) times daily as needed for cough.    Dispense:  20 capsule    Refill:  0  . promethazine-dextromethorphan (PROMETHAZINE-DM) 6.25-15 MG/5ML syrup    Sig: Take 5 mLs by mouth 4 (four) times daily as needed for cough.    Dispense:  118 mL    Refill:  0    Follow-up: - Return if symptoms worsen or fail to improve with current treatment plan  Patient verbalizes understanding with the above medical recommendations including the limitation of remote medical advice.  Specific follow-up and call-back criteria were given for patient to follow-up or seek medical care more urgently if needed.  - Time spent in direct consultation with patient on phone: 7 minutes  Harlin Rain, [facility] 02/02/2020, 2:41 PM

## 2020-02-03 ENCOUNTER — Encounter: Payer: Self-pay | Admitting: Family Medicine

## 2020-05-22 ENCOUNTER — Other Ambulatory Visit: Payer: Self-pay | Admitting: Obstetrics and Gynecology

## 2020-05-22 MED ORDER — NORETHINDRONE 0.35 MG PO TABS: 1.0000 | ORAL_TABLET | Freq: Every day | ORAL | 3 refills | Status: DC

## 2020-06-04 ENCOUNTER — Ambulatory Visit (INDEPENDENT_AMBULATORY_CARE_PROVIDER_SITE_OTHER): Payer: BC Managed Care – PPO | Admitting: Obstetrics and Gynecology

## 2020-06-04 ENCOUNTER — Other Ambulatory Visit: Payer: Self-pay

## 2020-06-04 ENCOUNTER — Encounter: Payer: Self-pay | Admitting: Obstetrics and Gynecology

## 2020-06-04 VITALS — BP 128/88 | Ht 67.0 in | Wt 273.0 lb

## 2020-06-04 DIAGNOSIS — Z3041 Encounter for surveillance of contraceptive pills: Secondary | ICD-10-CM | POA: Diagnosis not present

## 2020-06-04 DIAGNOSIS — Z01419 Encounter for gynecological examination (general) (routine) without abnormal findings: Secondary | ICD-10-CM

## 2020-06-04 DIAGNOSIS — Z1231 Encounter for screening mammogram for malignant neoplasm of breast: Secondary | ICD-10-CM | POA: Diagnosis not present

## 2020-06-04 DIAGNOSIS — Z1239 Encounter for other screening for malignant neoplasm of breast: Secondary | ICD-10-CM

## 2020-06-04 MED ORDER — SERTRALINE HCL 50 MG PO TABS: 50.0000 mg | ORAL_TABLET | Freq: Every day | ORAL | 3 refills | Status: DC

## 2020-06-04 MED ORDER — NORETHINDRONE 0.35 MG PO TABS: 1.0000 | ORAL_TABLET | Freq: Every day | ORAL | 3 refills | Status: DC

## 2020-06-04 NOTE — Progress Notes (Signed)
Gynecology Annual Exam  PCP: Olin Hauser, DO  Chief Complaint:  Chief Complaint  Patient presents with  . Gynecologic Exam    Annual - no concerns. RM 5    History of Present Illness:Patient is a 55 y.o. G1P1001 presents for annual exam. The patient has no complaints today.   LMP: No LMP recorded (within months). Absent on micronor since November 2021  The patient is sexually active. She denies dyspareunia.  The patient does perform self breast exams.  There is no notable family history of breast or ovarian cancer in her family.  The patient wears seatbelts: yes.   The patient has regular exercise: not asked.    The patient denies current symptoms of depression, well controlled on current dose of Zoloft     Review of Systems: Review of Systems  Constitutional: Negative for chills and fever.  HENT: Negative for congestion.   Respiratory: Negative for cough and shortness of breath.   Cardiovascular: Negative for chest pain and palpitations.  Gastrointestinal: Negative for abdominal pain, constipation, diarrhea, heartburn, nausea and vomiting.  Genitourinary: Negative for dysuria, frequency and urgency.  Skin: Negative for itching and rash.  Neurological: Negative for dizziness and headaches.  Endo/Heme/Allergies: Negative for polydipsia.  Psychiatric/Behavioral: Negative for depression.    Past Medical History:  Patient Active Problem List   Diagnosis Date Noted  . Acute non-recurrent sinusitis 02/02/2020  . Cough 02/02/2020  . Abnormal uterine bleeding (AUB)   . Thickened endometrium   . Cervical polyp   . Menometrorrhagia 05/23/2019  . Essential hypertension 07/13/2018  . Elevated hemoglobin A1c 07/13/2018  . Special screening for malignant neoplasms, colon   . Polyp of sigmoid colon   . Anxiety 04/17/2017  . Morbid obesity with BMI of 40.0-44.9, adult ([facility]) 04/17/2017    BMI 41.8 kg/m2     Past Surgical History:  Past Surgical History:   Procedure Laterality Date  . CESAREAN SECTION  12/1997  . COLONOSCOPY WITH PROPOFOL N/A 06/30/2017   Procedure: COLONOSCOPY WITH PROPOFOL;  Surgeon: [name], MD;  Location: [facility] ENDOSCOPY;  Service: Endoscopy;  Laterality: N/A;  . HYSTEROSCOPY WITH D & C N/A 07/26/2019   Procedure: DILATATION AND CURETTAGE /HYSTEROSCOPY;  Surgeon: Malachy Mood, MD;  Location: [facility] ORS;  Service: Gynecology;  Laterality: N/A;  . INTRAUTERINE DEVICE (IUD) INSERTION N/A 07/26/2019   Procedure: INTRAUTERINE DEVICE (IUD) INSERTION - Mirena;  Surgeon: Malachy Mood, MD;  Location: [facility] ORS;  Service: Gynecology;  Laterality: N/A;  . TONSILLECTOMY      Gynecologic History:  No LMP recorded (within months). Last Pap: Results were: 05/14/2018 NIL and HR HPV negative  07/26/2019 Hysteroscopy D&C showing benign polyp, no evidence of hyperplasia or malignancy  Last mammogram: 09/20/2019 Results were: BI-RAD I  Obstetric History: G1P1001  Family History:  Family History  Problem Relation Age of Onset  . Dementia Mother 53       Alzheimers  . Heart disease Father   . Aneurysm Father   . Diabetes Maternal Grandmother        type 1  . Heart disease Maternal Grandmother   . Heart disease Paternal Grandmother   . Breast cancer Neg Hx     Social History:  Social History   Socioeconomic History  . Marital status: Married    Spouse name: Dominica Severin  . Number of children: 1  . Years of education: 12+  . Highest education level: Not on file  Occupational History  . Not on file  Tobacco Use  . Smoking status: Former Research scientist (life sciences)  . Smokeless tobacco: Never Used  . Tobacco comment: quit smoking in 2009  Vaping Use  . Vaping Use: Never used  Substance and Sexual Activity  . Alcohol use: Yes    Comment: occasionally  . Drug use: No  . Sexual activity: Yes    Partners: Male    Birth control/protection: Pill  Other Topics Concern  . Not on file  Social History Narrative  . Not on file   Social  Determinants of Health   Financial Resource Strain: Not on file  Food Insecurity: Not on file  Transportation Needs: Not on file  Physical Activity: Not on file  Stress: Not on file  Social Connections: Not on file  Intimate Partner Violence: Not on file    Allergies:  No Known Allergies  Medications: Prior to Admission medications   Medication Sig Start Date End Date Taking? Authorizing Provider  b complex vitamins capsule Take 1 capsule by mouth daily.   Yes [provider]  Calcium Carb-Cholecalciferol (CALCIUM 600+D3 PO) Take 1 tablet by mouth daily.   Yes [provider]  cetirizine (ZYRTEC) 10 MG tablet Take 10 mg by mouth at bedtime.    Yes [provider]  Collagen Hydrolysate, Bovine, POWD Take 1 Scoop by mouth daily. WITH COFFEE   Yes [provider]  fluticasone (FLONASE) 50 MCG/ACT nasal spray Place 1 spray into both nostrils at bedtime.   Yes [provider]  Garlic 093 MG TBEC Take 1 tablet by mouth daily.   Yes [provider]  hydrochlorothiazide (HYDRODIURIL) 12.5 MG tablet Take 1 tablet (12.5 mg total) by mouth daily. 12/14/19  Yes Karamalegos, Devonne Doughty, DO  ibuprofen (ADVIL) 600 MG tablet Take 1 tablet (600 mg total) by mouth every 6 (six) hours as needed. 07/26/19  Yes Malachy Mood, MD  magnesium oxide (MAG-OX) 400 MG tablet Take 400 mg by mouth at bedtime.    Yes [provider]  Multiple Vitamin (MULTIVITAMIN WITH MINERALS) TABS tablet Take 1 tablet by mouth daily.   Yes [provider]  norethindrone (MICRONOR) 0.35 MG tablet Take 1 tablet (0.35 mg total) by mouth daily. 05/22/20  Yes Malachy Mood, MD  Omega-3 Fatty Acids (OMEGA 3 500) 500 MG CAPS Take 500 mg by mouth daily.   Yes [provider]  OVER THE COUNTER MEDICATION Take 500 mg by mouth daily. [place] Be[place]   Yes [provider]  sertraline (ZOLOFT) 50 MG tablet TAKE ONE TABLET EVERY DAY Patient taking  differently: Take 50 mg by mouth at bedtime. TAKE ONE TABLET EVERY DAY 07/14/19  Yes Dalia Heading, CNM  TURMERIC PO Take 1,000 mg by mouth daily.   Yes [provider]  amoxicillin-clavulanate (AUGMENTIN) 875-125 MG tablet Take 1 tablet by mouth 2 (two) times daily. Patient not taking: Reported on 06/04/2020 02/02/20   Verl Bangs, FNP  benzonatate (TESSALON) 100 MG capsule Take 1 capsule (100 mg total) by mouth 2 (two) times daily as needed for cough. Patient not taking: Reported on 06/04/2020 02/02/20   Verl Bangs, FNP  promethazine-dextromethorphan (PROMETHAZINE-DM) 6.25-15 MG/5ML syrup Take 5 mLs by mouth 4 (four) times daily as needed for cough. Patient not taking: Reported on 06/04/2020 02/02/20   Verl Bangs, FNP    Physical Exam Vitals: Blood pressure 128/88, height 5\' 7"  (1.702 m), weight 273 lb (123.8 kg).  General: NAD HEENT: normocephalic, anicteric Thyroid: no enlargement, no palpable nodules Pulmonary: No  increased work of breathing, CTAB Cardiovascular: RRR, distal pulses 2+ Breast: Breast symmetrical, no tenderness, no palpable nodules or masses, no skin or nipple retraction present, no nipple discharge.  No axillary or supraclavicular lymphadenopathy. Abdomen: NABS, soft, non-tender, non-distended.  Umbilicus without lesions.  No hepatomegaly, splenomegaly or masses palpable. No evidence of hernia  Genitourinary:  External: Normal external female genitalia.  Normal urethral meatus, normal Bartholin's and Skene's glands.    Vagina: Normal vaginal mucosa, no evidence of prolapse.    Cervix: Grossly normal in appearance, no bleeding  Uterus: Non-enlarged, mobile, normal contour.  No CMT  Adnexa: ovaries non-enlarged, no adnexal masses  Rectal: deferred  Lymphatic: no evidence of inguinal lymphadenopathy Extremities: no edema, erythema, or tenderness Neurologic: Grossly intact Psychiatric: mood appropriate, affect full  Female chaperone present for  pelvic and breast  portions of the physical exam     Assessment: 55 y.o. G1P1001 routine annual exam  Plan: Problem List Items Addressed This Visit   None   Visit Diagnoses    Encounter for gynecological examination without abnormal finding    -  Primary   Breast screening       Breast cancer screening by mammogram       Relevant Orders   MM 3D SCREEN BREAST BILATERAL   Encounter for surveillance of contraceptive pills          1) Mammogram - recommend yearly screening mammogram.  Mammogram Is up to date - due in 3 months ordered  2) STI screening  was notoffered and therefore not obtained  3) ASCCP guidelines and rational discussed.  Patient opts for every 3 years screening interval  4) Osteoporosis  - per USPTF routine screening DEXA at age 2  Consider FDA-approved medical therapies in postmenopausal women and men aged 21 years and older, based on the following: a) A hip or vertebral (clinical or morphometric) fracture b) T-score ? -2.5 at the femoral neck or spine after appropriate evaluation to exclude secondary causes C) Low bone mass (T-score between -1.0 and -2.5 at the femoral neck or spine) and a 10-year probability of a hip fracture ? 3% or a 10-year probability of a major osteoporosis-related fracture ? 20% based on the [place]-adapted WHO algorithm   5) Routine healthcare maintenance including cholesterol, diabetes screening discussed managed by PCP   6) Return in about 1 year (around 06/04/2021) for annual.    Malachy Mood, MD Mosetta Pigeon, [place] 06/04/2020, 11:09 AM

## 2020-06-04 NOTE — Patient Instructions (Addendum)
[facility] [place] [address]  [facility] [place]  [address] [place] [address]  Phone: [phone]  Your next mammogram should be done in June 2022

## 2020-06-13 ENCOUNTER — Encounter: Payer: Self-pay | Admitting: Family Medicine

## 2020-06-13 ENCOUNTER — Other Ambulatory Visit: Payer: Self-pay

## 2020-06-13 ENCOUNTER — Ambulatory Visit: Payer: BC Managed Care – PPO | Admitting: Family Medicine

## 2020-06-13 VITALS — BP 130/80 | HR 74 | Ht 67.0 in | Wt 271.0 lb

## 2020-06-13 DIAGNOSIS — I10 Essential (primary) hypertension: Secondary | ICD-10-CM

## 2020-06-13 DIAGNOSIS — Z6841 Body Mass Index (BMI) 40.0 and over, adult: Secondary | ICD-10-CM | POA: Diagnosis not present

## 2020-06-13 DIAGNOSIS — R7309 Other abnormal glucose: Secondary | ICD-10-CM | POA: Diagnosis not present

## 2020-06-13 DIAGNOSIS — F419 Anxiety disorder, unspecified: Secondary | ICD-10-CM | POA: Diagnosis not present

## 2020-06-13 NOTE — Assessment & Plan Note (Signed)
Improved HTN control on thiazide Needs more home BP readings No known complications Morbid obesity On OCP per GYN   Plan:  1. Continue/refill HCTZ 12.5mg  daily - for BP and edema 2. Encourage improved lifestyle - low sodium diet, regular exercise 3. monitor BP outside office, with new cuff for arm, bring readings to next visit, if persistently >140/90 or new symptoms notify office sooner

## 2020-06-13 NOTE — Assessment & Plan Note (Signed)
Due for lab today A1c Prior 5.7 range Counseling on lifestyle

## 2020-06-13 NOTE — Assessment & Plan Note (Signed)
BMI >42 Weight loss in past 6 months with success

## 2020-06-13 NOTE — Progress Notes (Signed)
Subjective:    Patient ID: Leslie Ross, female    DOB: November 03, 1965, 55 y.o.   MRN: [id number]  Leslie Ross is a 55 y.o. female presenting on 06/13/2020 for Hypertension and Obesity   HPI   CHRONIC HTN Morbid Obesity BMI >42  History of elevated BP for months to years >140/90s. In past >10+ year ago was treated with medication for HTN, believes it was a fluid pill. Has been off med for many years. Now sees OBGYN routinely asked to come here for new PCP due to HTN. She will start checking BP at home more often. Current Meds -HCTZ 12.5mg  - Taking supplement Garlique Lifestyle: - Diet:reducing sodium salt in diet,  - Exercise:gym 4 x week, cardio weight training Admits occasional edema in feet/ankles at times, worse if prolong sitting Denies CP, dyspnea, HA, edema, dizziness / lightheadedness  Irregular Menstrual Cycle Previously followed by Dalia Heading CNM [facility] Now followed by Dr Georgianne Fick Recent mammogram ordered On OCP, she is not in menopause at this time, she has some peri-menopausal symptoms, including temperature instability.  Anxiety On Sertraline low dose 50mg  daily, already refilled by GYN. Denies depression.  Health Maintenance: She does annual physical with GYN Up to date colonoscopy on file in chart. Next due 06/2027. (last done 06/2017, good for 10 years)  Not up to date on COVID19 vaccine.   Depression screen [facility] 2/9 06/13/2020 06/04/2020 12/14/2019  Decreased Interest 0 0 0  Down, Depressed, Hopeless 0 0 0  PHQ - 2 Score 0 0 0  Altered sleeping - 1 -  Tired, decreased energy - 1 -  Change in appetite - 1 -  Feeling bad or failure about yourself  - 1 -  Trouble concentrating - 0 -  Moving slowly or fidgety/restless - 0 -  Suicidal thoughts - 0 -  PHQ-9 Score - 4 -  Difficult doing work/chores Not difficult at all Not difficult at all -    Social History   Tobacco Use  . Smoking status: Former Research scientist (life sciences)  . Smokeless tobacco:  Never Used  . Tobacco comment: quit smoking in 2009  Vaping Use  . Vaping Use: Never used  Substance Use Topics  . Alcohol use: Yes    Comment: occasionally  . Drug use: No    Review of Systems Per HPI unless specifically indicated above     Objective:    BP 130/80 (BP Location: Left Arm, Cuff Size: Normal)   Pulse 74   Ht 5\' 7"  (1.702 m)   Wt 271 lb (122.9 kg)   SpO2 100%   BMI 42.44 kg/m   Wt Readings from Last 3 Encounters:  06/13/20 271 lb (122.9 kg)  06/04/20 273 lb (123.8 kg)  12/14/19 280 lb 9.6 oz (127.3 kg)    Physical Exam Vitals and nursing note reviewed.  Constitutional:      General: She is not in acute distress.    Appearance: She is well-developed. She is obese. She is not diaphoretic.     Comments: Well-appearing, comfortable, cooperative  HENT:     Head: Normocephalic and atraumatic.  Eyes:     General:        Right eye: No discharge.        Left eye: No discharge.     Conjunctiva/sclera: Conjunctivae normal.  Neck:     Thyroid: No thyromegaly.  Cardiovascular:     Rate and Rhythm: Normal rate and regular rhythm.     Heart sounds: Normal  heart sounds. No murmur heard.   Pulmonary:     Effort: Pulmonary effort is normal. No respiratory distress.     Breath sounds: Normal breath sounds. No wheezing or rales.  Musculoskeletal:        General: Normal range of motion.     Cervical back: Normal range of motion and neck supple.     Right lower leg: No edema.     Left lower leg: No edema.  Lymphadenopathy:     Cervical: No cervical adenopathy.  Skin:    General: Skin is warm and dry.     Findings: No erythema or rash.  Neurological:     Mental Status: She is alert and oriented to person, place, and time.  Psychiatric:        Behavior: Behavior normal.     Comments: Well groomed, good eye contact, normal speech and thoughts       Results for orders placed or performed during the hospital encounter of 07/26/19  Pregnancy, urine POC   Result Value Ref Range   Preg Test, Ur NEGATIVE NEGATIVE  Surgical pathology  Result Value Ref Range   SURGICAL PATHOLOGY      SURGICAL PATHOLOGY CASE: [id number] PATIENT: London Pepper Surgical Pathology Report     Specimen Submitted: A. Endometrial polyps and curettings  Clinical History: AUB N93.9, thickened endometrium R93.89    DIAGNOSIS: A. ENDOMETRIUM; CURETTAGE: - FRAGMENTS OF BENIGN ENDOMETRIAL POLYP. - BACKGROUND DISORDERED PROLIFERATIVE ENDOMETRIUM. - FRAGMENTS OF BENIGN ENDOCERVICAL GLANDULAR TISSUE. - NEGATIVE FOR ATYPICAL HYPERPLASIA/EIN, DYSPLASIA, AND MALIGNANCY.  GROSS DESCRIPTION: A. Labeled: Endometrial polyps and endometrial curettings Received: Formalin Tissue fragment(s): Multiple Size: Aggregate, 2.5 x 2.0 x 0.3 cm Description: Received are minimal, irregular fragments of tan soft tissue admixed with mucinous material.  Filtered into a mesh bag. Entirely submitted in cassette 1.    Final Diagnosis performed by Allena Napoleon, MD.   Electronically signed 07/28/2019 9:40:07AM The electronic signature indicates that the named Attending Pathologist has ev aluated the specimen Technical component performed at [facility], [address], [place], [place] [address] Lab: [id number] Dir: Rush Farmer, MD, MMM  Professional component performed at [facility], [facility], [place], [place],  [address] Lab: [id number] Dir: Dellia Nims. Rubinas, MD       Assessment & Plan:   Problem List Items Addressed This Visit    Morbid obesity with BMI of 40.0-44.9, adult ([place])    BMI >42 Weight loss in past 6 months with success      Relevant Orders   COMPLETE METABOLIC PANEL WITH GFR   Lipid panel   TSH   Essential hypertension - Primary    Improved HTN control on thiazide Needs more home BP readings No known complications Morbid obesity On OCP per GYN   Plan:  1. Continue/refill HCTZ 12.5mg  daily - for BP and edema 2.  Encourage improved lifestyle - low sodium diet, regular exercise 3. monitor BP outside office, with new cuff for arm, bring readings to next visit, if persistently >140/90 or new symptoms notify office sooner      Relevant Orders   CBC with Differential/Platelet   COMPLETE METABOLIC PANEL WITH GFR   TSH   Elevated hemoglobin A1c    Due for lab today A1c Prior 5.7 range Counseling on lifestyle      Relevant Orders   Hemoglobin A1c   Anxiety    Stable, controlled on SSRI Sertraline per OBGYN Not endorsing depression  Updated Health Maintenance information UTD Colonoscopy, 2019 next due 2029 GYN has ordered Mammogram screening, prior negative Declines vaccines. Reviewed recent lab results with patient Encouraged improvement to lifestyle with diet and exercise Goal of weight loss   No orders of the defined types were placed in this encounter.     Follow up plan: Return in about 1 year (around 06/13/2021) for 1 Year Follow-up HTN - fasting lab in AM after visit.Nobie Putnam, DO [facility] 06/13/2020, 8:12 AM

## 2020-06-13 NOTE — Assessment & Plan Note (Signed)
Stable, controlled on SSRI Sertraline per OBGYN Not endorsing depression

## 2020-06-13 NOTE — Patient Instructions (Addendum)
Thank you for coming to the office today.  Keep up the great work Check BP occasionally 1-2 times a week, L arm, goal < 140/90 if persistent elevated trend, notify us and follow up and share readings.  Keep on current BP med, refills for 6 more months, can request more when ready. Current dose is working well.  Mild swelling in feet/ ankles feet is okay, keep with elevation and improve activity  Labs today  NEXT TIME - DUE for FASTING BLOOD WORK (no food or drink after midnight before the lab appointment, only water or coffee without cream/sugar on the morning of)   Please schedule a Follow-up Appointment to: Return in about 1 year (around 06/13/2021) for 1 Year Follow-up HTN - fasting lab in AM after visit..  If you have any other questions or concerns, please feel free to call the office or send a message through [place]. You may also schedule an earlier appointment if necessary.  Additionally, you may be receiving a survey about your experience at our office within a few days to 1 week by e-mail or mail. We value your feedback.  Nobie Putnam, DO [place]

## 2020-06-14 LAB — COMPLETE METABOLIC PANEL WITH GFR
AG Ratio: 1.6 (calc) (ref 1.0–2.5)
ALT: 14 U/L (ref 6–29)
AST: 14 U/L (ref 10–35)
Albumin: 4.4 g/dL (ref 3.6–5.1)
Alkaline phosphatase (APISO): 53 U/L (ref 37–153)
BUN: 16 mg/dL (ref 7–25)
CO2: 25 mmol/L (ref 20–32)
Calcium: 9.9 mg/dL (ref 8.6–10.4)
Chloride: 104 mmol/L (ref 98–110)
Creat: 0.74 mg/dL (ref 0.50–1.05)
GFR, Est African American: 108 mL/min/{1.73_m2} (ref >=60)
GFR, Est Non African American: 93 mL/min/{1.73_m2} (ref >=60)
Globulin: 2.8 g/dL (calc) (ref 1.9–3.7)
Glucose, Bld: 93 mg/dL (ref 65–99)
Potassium: 4.4 mmol/L (ref 3.5–5.3)
Sodium: 138 mmol/L (ref 135–146)
Total Bilirubin: 0.5 mg/dL (ref 0.2–1.2)
Total Protein: 7.2 g/dL (ref 6.1–8.1)

## 2020-06-14 LAB — CBC WITH DIFFERENTIAL/PLATELET
Absolute Monocytes: 533 cells/uL (ref 200–950)
Basophils Absolute: 67 cells/uL (ref 0–200)
Basophils Relative: 0.9 %
Eosinophils Absolute: 333 cells/uL (ref 15–500)
Eosinophils Relative: 4.5 %
HCT: 46.6 % — ABNORMAL HIGH (ref 35.0–45.0)
Hemoglobin: 15.5 g/dL (ref 11.7–15.5)
Lymphs Abs: 2057 cells/uL (ref 850–3900)
MCH: 30.2 pg (ref 27.0–33.0)
MCHC: 33.3 g/dL (ref 32.0–36.0)
MCV: 90.7 fL (ref 80.0–100.0)
MPV: 10.1 fL (ref 7.5–12.5)
Monocytes Relative: 7.2 %
Neutro Abs: 4410 cells/uL (ref 1500–7800)
Neutrophils Relative %: 59.6 %
Platelets: 420 10*3/uL — ABNORMAL HIGH (ref 140–400)
RBC: 5.14 10*6/uL — ABNORMAL HIGH (ref 3.80–5.10)
RDW: 12.2 % (ref 11.0–15.0)
Total Lymphocyte: 27.8 %
WBC: 7.4 10*3/uL (ref 3.8–10.8)

## 2020-06-14 LAB — LIPID PANEL
Cholesterol: 175 mg/dL (ref <200)
HDL: 47 mg/dL — ABNORMAL LOW (ref >=50)
LDL Cholesterol (Calc): 110 mg/dL (calc) — ABNORMAL HIGH
Non-HDL Cholesterol (Calc): 128 mg/dL (calc) (ref <130)
Total CHOL/HDL Ratio: 3.7 (calc) (ref <5.0)
Triglycerides: 86 mg/dL (ref <150)

## 2020-06-14 LAB — HEMOGLOBIN A1C
Hgb A1c MFr Bld: 5.8 % of total Hgb — ABNORMAL HIGH (ref <5.7)
Mean Plasma Glucose: 120 mg/dL
eAG (mmol/L): 6.6 mmol/L

## 2020-06-14 LAB — TSH: TSH: 1.32 mIU/L

## 2020-09-20 ENCOUNTER — Encounter: Payer: Self-pay | Admitting: Physician Assistant

## 2020-09-20 ENCOUNTER — Telehealth: Payer: BC Managed Care – PPO | Admitting: Physician Assistant

## 2020-09-20 DIAGNOSIS — U071 COVID-19: Secondary | ICD-10-CM

## 2020-09-20 MED ORDER — ONDANSETRON HCL 4 MG PO TABS: 4.0000 mg | ORAL_TABLET | Freq: Three times a day (TID) | ORAL | 0 refills | Status: DC | PRN

## 2020-09-20 MED ORDER — MOLNUPIRAVIR EUA 200MG CAPSULE: 4.0000 | ORAL_CAPSULE | Freq: Two times a day (BID) | ORAL | 0 refills | Status: AC

## 2020-09-20 MED ORDER — ALBUTEROL SULFATE HFA 108 (90 BASE) MCG/ACT IN AERS: 2.0000 | INHALATION_SPRAY | RESPIRATORY_TRACT | 0 refills | Status: DC | PRN

## 2020-09-20 MED ORDER — BENZONATATE 100 MG PO CAPS: 100.0000 mg | ORAL_CAPSULE | Freq: Three times a day (TID) | ORAL | 0 refills | Status: DC | PRN

## 2020-09-20 MED ORDER — AEROCHAMBER PLUS FLO-VU MEDIUM MISC: 1.0000 | Freq: Once | 0 refills | Status: AC

## 2020-09-20 NOTE — Progress Notes (Signed)
Ms. drake, landing are scheduled for a virtual visit with your provider today.    Just as we do with appointments in the office, we must obtain your consent to participate.  Your consent will be active for this visit and any virtual visit you may have with one of our providers in the next 365 days.    If you have a MyChart account, I can also send a copy of this consent to you electronically.  All virtual visits are billed to your insurance company just like a traditional visit in the office.  As this is a virtual visit, video technology does not allow for your provider to perform a traditional examination.  This may limit your provider's ability to fully assess your condition.  If your provider identifies any concerns that need to be evaluated in person or the need to arrange testing such as labs, EKG, etc, we will make arrangements to do so.    Although advances in technology are sophisticated, we cannot ensure that it will always work on either your end or our end.  If the connection with a video visit is poor, we may have to switch to a telephone visit.  With either a video or telephone visit, we are not always able to ensure that we have a secure connection.   I need to obtain your verbal consent now.   Are you willing to proceed with your visit today?   RAYSA BOSAK has provided verbal consent on 09/20/2020 for a virtual visit (video or telephone).   Abigail Butts, PA-C 09/20/2020  7:07 PM   Date:  09/20/2020   ID:  Romualdo Bolk, DOB 09-26-1965, MRN [id number]  Patient Location: Home Provider Location: Home Office   Participants: Patient and Provider for Visit and Wrap up  Method of visit: Video  Location of Patient: Home Location of Provider: Home Office Consent was obtain for visit over the video. Services rendered by provider: Visit was performed via video  A video enabled telemedicine application was used and I verified that I am speaking with the correct person  using two identifiers.  PCP:  Olin Hauser, DO   Chief Complaint:  COVID +  History of Present Illness:    Leslie Ross is a 55 y.o. female with history as stated below. Presents video telehealth for an acute care visit  Onset of symptoms was 09/18/20 and symptoms have been persistent and include: nasal congestion, abd cramping, nausea, low grade fever, achy.      No known sick contacts  Modifying factors include: aleve and nasal rises with minor improvement.  No other aggravating or relieving factors.  No other c/o.   The patient does have symptoms concerning for COVID-19 infection (fever, chills, cough, or new shortness of breath).  Patient has been tested for COVID during this illness - result: positive  Pt is NOT vaccinated.  Hx of HTN.  Past Medical, Surgical, Social History, Allergies, and Medications have been Reviewed.  Patient Active Problem List   Diagnosis Date Noted   Acute non-recurrent sinusitis 02/02/2020   Cough 02/02/2020   Abnormal uterine bleeding (AUB)    Thickened endometrium    Cervical polyp    Menometrorrhagia 05/23/2019   Essential hypertension 07/13/2018   Elevated hemoglobin A1c 07/13/2018   Special screening for malignant neoplasms, colon    Polyp of sigmoid colon    Anxiety 04/17/2017   Morbid obesity with BMI of 40.0-44.9, adult ([place]) 04/17/2017    Social History  Tobacco Use   Smoking status: Former    Pack years: 0.00   Smokeless tobacco: Never   Tobacco comments:    quit smoking in 2009  Substance Use Topics   Alcohol use: Yes    Comment: occasionally     Current Outpatient Medications:    albuterol (VENTOLIN HFA) 108 (90 Base) MCG/ACT inhaler, Inhale 2 puffs into the lungs every 2 (two) hours as needed for wheezing or shortness of breath (cough)., Disp: 8 g, Rfl: 0   benzonatate (TESSALON PERLES) 100 MG capsule, Take 1 capsule (100 mg total) by mouth 3 (three) times daily as needed for cough (cough)., Disp:  20 capsule, Rfl: 0   molnupiravir EUA 200 mg CAPS, Take 4 capsules (800 mg total) by mouth 2 (two) times daily for 5 days., Disp: 40 capsule, Rfl: 0   ondansetron (ZOFRAN) 4 MG tablet, Take 1 tablet (4 mg total) by mouth every 8 (eight) hours as needed for nausea or vomiting., Disp: 10 tablet, Rfl: 0   Spacer/Aero-Holding Chambers (AEROCHAMBER PLUS FLO-VU MEDIUM) MISC, 1 each by Other route once for 1 dose., Disp: 1 each, Rfl: 0   b complex vitamins capsule, Take 1 capsule by mouth daily., Disp: , Rfl:    Calcium Carb-Cholecalciferol (CALCIUM 600+D3 PO), Take 1 tablet by mouth daily., Disp: , Rfl:    cetirizine (ZYRTEC) 10 MG tablet, Take 10 mg by mouth at bedtime. , Disp: , Rfl:    Collagen Hydrolysate, Bovine, POWD, Take 1 Scoop by mouth daily. WITH COFFEE, Disp: , Rfl:    fluticasone (FLONASE) 50 MCG/ACT nasal spray, Place 1 spray into both nostrils at bedtime., Disp: , Rfl:    Garlic 979 MG TBEC, Take 1 tablet by mouth daily., Disp: , Rfl:    hydrochlorothiazide (HYDRODIURIL) 12.5 MG tablet, Take 1 tablet (12.5 mg total) by mouth daily., Disp: 90 tablet, Rfl: 3   ibuprofen (ADVIL) 600 MG tablet, Take 1 tablet (600 mg total) by mouth every 6 (six) hours as needed., Disp: 60 tablet, Rfl: 3   magnesium oxide (MAG-OX) 400 MG tablet, Take 400 mg by mouth at bedtime. , Disp: , Rfl:    Multiple Vitamin (MULTIVITAMIN WITH MINERALS) TABS tablet, Take 1 tablet by mouth daily., Disp: , Rfl:    norethindrone (MICRONOR) 0.35 MG tablet, Take 1 tablet (0.35 mg total) by mouth daily., Disp: 84 tablet, Rfl: 3   Omega-3 Fatty Acids (OMEGA 3 500) 500 MG CAPS, Take 500 mg by mouth daily., Disp: , Rfl:    OVER THE COUNTER MEDICATION, Take 500 mg by mouth daily. [place] Beets, Disp: , Rfl:    sertraline (ZOLOFT) 50 MG tablet, Take 1 tablet (50 mg total) by mouth at bedtime. TAKE ONE TABLET EVERY DAY, Disp: 90 tablet, Rfl: 3   TURMERIC PO, Take 1,000 mg by mouth daily., Disp: , Rfl:    No Known Allergies    Review of Systems  Constitutional:  Positive for fever. Negative for chills.  HENT:  Positive for congestion. Negative for ear pain and sore throat.   Eyes:  Negative for blurred vision and double vision.  Respiratory:  Positive for cough. Negative for shortness of breath and wheezing.   Cardiovascular:  Negative for chest pain, palpitations and leg swelling.  Gastrointestinal:  Positive for abdominal pain and nausea. Negative for diarrhea and vomiting.  Genitourinary:  Negative for dysuria.  Musculoskeletal:  Positive for myalgias.  Skin:  Negative for rash.  Neurological:  Negative for loss of consciousness, weakness and  headaches.  Psychiatric/Behavioral:  The patient is not nervous/anxious.   See HPI for history of present illness.  Physical Exam Constitutional:      General: She is not in acute distress.    Appearance: Normal appearance. She is not ill-appearing.  HENT:     Head: Normocephalic and atraumatic.     Nose: No congestion.  Eyes:     Extraocular Movements: Extraocular movements intact.  Pulmonary:     Effort: Pulmonary effort is normal.     Comments: Speaks in full sentences Musculoskeletal:        General: Normal range of motion.     Cervical back: Normal range of motion.  Skin:    Coloration: Skin is not pale.  Neurological:     General: No focal deficit present.     Mental Status: She is alert. Mental status is at baseline.  Psychiatric:        Mood and Affect: Mood normal.              A&P  1. COVID-19  - Zofran for nausea  - Tessalon for cough  - Albuterol for wheezing  - Molnupiravir is the antiviral  - rest, drink plenty of fluids  - to Urgent Care or ER for difficulty breathing, dehydration or other concerns   Patient voiced understanding and agreement to plan.   Time:   Today, I have spent 15 minutes with the patient with telehealth technology discussing the above problems, reviewing the chart, previous notes, medications and orders.     Tests Ordered: No orders of the defined types were placed in this encounter.   Medication Changes: Meds ordered this encounter  Medications   ondansetron (ZOFRAN) 4 MG tablet    Sig: Take 1 tablet (4 mg total) by mouth every 8 (eight) hours as needed for nausea or vomiting.    Dispense:  10 tablet    Refill:  0   benzonatate (TESSALON PERLES) 100 MG capsule    Sig: Take 1 capsule (100 mg total) by mouth 3 (three) times daily as needed for cough (cough).    Dispense:  20 capsule    Refill:  0   albuterol (VENTOLIN HFA) 108 (90 Base) MCG/ACT inhaler    Sig: Inhale 2 puffs into the lungs every 2 (two) hours as needed for wheezing or shortness of breath (cough).    Dispense:  8 g    Refill:  0   Spacer/Aero-Holding Chambers (AEROCHAMBER PLUS FLO-VU MEDIUM) MISC    Sig: 1 each by Other route once for 1 dose.    Dispense:  1 each    Refill:  0   molnupiravir EUA 200 mg CAPS    Sig: Take 4 capsules (800 mg total) by mouth 2 (two) times daily for 5 days.    Dispense:  40 capsule    Refill:  0     Disposition:  Follow up an needed at [facility] or in ER  Signed, Abigail Butts, PA-C  09/20/2020 7:07 PM

## 2020-09-20 NOTE — Patient Instructions (Signed)
1. COVID-19  - Zofran for nausea  - Tessalon for cough  - Albuterol for wheezing  - Molnupiravir is the antiviral  - rest, drink plenty of fluids  - to Urgent Care or ER for difficulty breathing, dehydration or other concerns

## 2021-01-16 ENCOUNTER — Other Ambulatory Visit: Payer: Self-pay | Admitting: Family Medicine

## 2021-01-16 DIAGNOSIS — I10 Essential (primary) hypertension: Secondary | ICD-10-CM

## 2021-01-16 NOTE — Telephone Encounter (Signed)
Requested Prescriptions  Pending Prescriptions Disp Refills  . hydrochlorothiazide (HYDRODIURIL) 12.5 MG tablet [Pharmacy Med Name: HYDROCHLOROTHIAZIDE 12.5 MG TAB] 90 tablet 3    Sig: TAKE 1 TABLET BY MOUTH DAILY     Cardiovascular: Diuretics - Thiazide Failed - 01/16/2021  9:58 AM      Failed - Valid encounter within last 6 months    Recent Outpatient Visits          7 months ago Essential hypertension   [facility] [place], Devonne Doughty, DO   11 months ago Acute non-recurrent sinusitis, unspecified location   [place], FNP   1 year ago Essential hypertension   [place], Devonne Doughty, DO   2 years ago Essential hypertension   [place], DO      Future Appointments            In 4 months [place], Devonne Doughty, DO [facility], [place] in normal range and within 360 days    Calcium  Date Value Ref Range Status  06/13/2020 9.9 8.6 - 10.4 mg/dL Final         Passed - Cr in normal range and within 360 days    Creat  Date Value Ref Range Status  06/13/2020 0.74 0.50 - 1.05 mg/dL Final    Comment:    For patients >4 years of age, the reference limit for Creatinine is approximately 13% higher for people identified as African-American. .          Passed - K in normal range and within 360 days    Potassium  Date Value Ref Range Status  06/13/2020 4.4 3.5 - 5.3 mmol/L Final         Passed - Na in normal range and within 360 days    Sodium  Date Value Ref Range Status  06/13/2020 138 135 - 146 mmol/L Final  05/14/2018 138 134 - 144 mmol/L Final         Passed - Last BP in normal range    BP Readings from Last 1 Encounters:  06/13/20 130/80

## 2021-02-01 ENCOUNTER — Ambulatory Visit: Payer: Self-pay

## 2021-02-01 ENCOUNTER — Telehealth: Payer: BC Managed Care – PPO | Admitting: Nurse Practitioner

## 2021-02-01 DIAGNOSIS — R051 Acute cough: Secondary | ICD-10-CM

## 2021-02-01 MED ORDER — AZITHROMYCIN 250 MG PO TABS: ORAL_TABLET | ORAL | 0 refills | Status: DC

## 2021-02-01 MED ORDER — BENZONATATE 100 MG PO CAPS: 100.0000 mg | ORAL_CAPSULE | Freq: Three times a day (TID) | ORAL | 0 refills | Status: DC | PRN

## 2021-02-01 NOTE — Progress Notes (Signed)
We are sorry that you are not feeling well.  Here is how we plan to help! ? ?Based on your presentation I believe you most likely have A cough due to bacteria.  When patients have a fever and a productive cough with a change in color or increased sputum production, we are concerned about bacterial bronchitis.  If left untreated it can progress to pneumonia.  If your symptoms do not improve with your treatment plan it is important that you contact your provider.   I have prescribed Azithromyin 250 mg: two tablets now and then one tablet daily for 4 additonal days  ?  ?In addition you may use A prescription cough medication called Tessalon Perles 100mg. You may take 1-2 capsules every 8 hours as needed for your cough. ? ? ?From your responses in the eVisit questionnaire you describe inflammation in the upper respiratory tract which is causing a significant cough.  This is commonly called Bronchitis and has four common causes:   ?Allergies ?Viral Infections ?Acid Reflux ?Bacterial Infection ?Allergies, viruses and acid reflux are treated by controlling symptoms or eliminating the cause. An example might be a cough caused by taking certain blood pressure medications. You stop the cough by changing the medication. Another example might be a cough caused by acid reflux. Controlling the reflux helps control the cough. ? ?USE OF BRONCHODILATOR ("RESCUE") INHALERS: ?There is a risk from using your bronchodilator too frequently.  The risk is that over-reliance on a medication which only relaxes the muscles surrounding the breathing tubes can reduce the effectiveness of medications prescribed to reduce swelling and congestion of the tubes themselves.  Although you feel brief relief from the bronchodilator inhaler, your asthma may actually be worsening with the tubes becoming more swollen and filled with mucus.  This can delay other crucial treatments, such as oral steroid medications. If you need to use a bronchodilator  inhaler daily, several times per day, you should discuss this with your provider.  There are probably better treatments that could be used to keep your asthma under control.  ?   ?HOME CARE ?Only take medications as instructed by your medical team. ?Complete the entire course of an antibiotic. ?Drink plenty of fluids and get plenty of rest. ?Avoid close contacts especially the very young and the elderly ?Cover your mouth if you cough or cough into your sleeve. ?Always remember to wash your hands ?A steam or ultrasonic humidifier can help congestion.  ? ?GET HELP RIGHT AWAY IF: ?You develop worsening fever. ?You become short of breath ?You cough up blood. ?Your symptoms persist after you have completed your treatment plan ?MAKE SURE YOU  ?Understand these instructions. ?Will watch your condition. ?Will get help right away if you are not doing well or get worse. ?  ? ?Thank you for choosing an e-visit. ? ?Your e-visit answers were reviewed by a board certified advanced clinical practitioner to complete your personal care plan. Depending upon the condition, your plan could have included both over the counter or prescription medications. ? ?Please review your pharmacy choice. Make sure the pharmacy is open so you can pick up prescription now. If there is a problem, you may contact your provider through MyChart messaging and have the prescription routed to another pharmacy.  Your safety is important to us. If you have drug allergies check your prescription carefully.  ? ?For the next 24 hours you can use MyChart to ask questions about today's visit, request a non-urgent call back, or ask   for a work or school excuse. ?You will get an email in the next two days asking about your experience. I hope that your e-visit has been valuable and will speed your recovery. ? ?5-10 minutes spent reviewing and documenting in chart. ? ?

## 2021-02-01 NOTE — Telephone Encounter (Signed)
Pt started coughing yesterday, and just overnight she developed lots of congestion and coughing.  Would like video visit if possible. No appts I can schedule today.  Pt states she has got to get something to help.  She is going to take covid test in a few minutes.  Pt. Started coughing yesterday. Productive with clear mucus. Has wheezing as well, mild shortness of breath.COVID 19 home test negative this morning. No availability in the practice. Pt. Will do e-visit through My Chart.    Reason for Disposition  [1] MILD difficulty breathing (e.g., minimal/no SOB at rest, SOB with walking, pulse <100) AND [2] still present when not coughing  Answer Assessment - Initial Assessment Questions 1. ONSET: "When did the cough begin?"      Yesterday 2. SEVERITY: "How bad is the cough today?"      Moderate 3. SPUTUM: "Describe the color of your sputum" (none, dry cough; clear, white, yellow, green)     Clear 4. HEMOPTYSIS: "Are you coughing up any blood?" If so ask: "How much?" (flecks, streaks, tablespoons, etc.)     No 5. DIFFICULTY BREATHING: "Are you having difficulty breathing?" If Yes, ask: "How bad is it?" (e.g., mild, moderate, severe)    - MILD: No SOB at rest, mild SOB with walking, speaks normally in sentences, can lie down, no retractions, pulse < 100.    - MODERATE: SOB at rest, SOB with minimal exertion and prefers to sit, cannot lie down flat, speaks in phrases, mild retractions, audible wheezing, pulse 100-120.    - SEVERE: Very SOB at rest, speaks in single words, struggling to breathe, sitting hunched forward, retractions, pulse > 120      Mild 6. FEVER: "Do you have a fever?" If Yes, ask: "What is your temperature, how was it measured, and when did it start?"     No 7. CARDIAC HISTORY: "Do you have any history of heart disease?" (e.g., heart attack, congestive heart failure)      No 8. LUNG HISTORY: "Do you have any history of lung disease?"  (e.g., pulmonary embolus, asthma,  emphysema)     No 9. PE RISK FACTORS: "Do you have a history of blood clots?" (or: recent major surgery, recent prolonged travel, bedridden)     No 10. OTHER SYMPTOMS: "Do you have any other symptoms?" (e.g., runny nose, wheezing, chest pain)       Wheezing 11. PREGNANCY: "Is there any chance you are pregnant?" "When was your last menstrual period?"       No 12. TRAVEL: "Have you traveled out of the country in the last month?" (e.g., travel history, exposures)       No  Protocols used: Cough - Acute Productive-A-AH

## 2021-03-26 DIAGNOSIS — M25511 Pain in right shoulder: Secondary | ICD-10-CM | POA: Diagnosis not present

## 2021-03-26 DIAGNOSIS — M19011 Primary osteoarthritis, right shoulder: Secondary | ICD-10-CM | POA: Diagnosis not present

## 2021-03-26 DIAGNOSIS — M7541 Impingement syndrome of right shoulder: Secondary | ICD-10-CM | POA: Diagnosis not present

## 2021-03-26 DIAGNOSIS — G8929 Other chronic pain: Secondary | ICD-10-CM | POA: Diagnosis not present

## 2021-04-16 ENCOUNTER — Other Ambulatory Visit: Payer: Self-pay | Admitting: Family Medicine

## 2021-04-16 DIAGNOSIS — I10 Essential (primary) hypertension: Secondary | ICD-10-CM

## 2021-04-16 NOTE — Telephone Encounter (Signed)
Courtesy refill #[id number] provided up to appt date of 06/14/21.  Requested Prescriptions  Pending Prescriptions Disp Refills   hydrochlorothiazide (HYDRODIURIL) 12.5 MG tablet [Pharmacy Med Name: HYDROCHLOROTHIAZIDE 12.5 MG TAB] 60 tablet 0    Sig: TAKE 1 TABLET BY MOUTH DAILY     Cardiovascular: Diuretics - Thiazide Failed - 04/16/2021  8:45 AM      Failed - Valid encounter within last 6 months    Recent Outpatient Visits          10 months ago Essential hypertension   [place], Devonne Doughty, DO   1 year ago Acute non-recurrent sinusitis, unspecified location   [place], FNP   1 year ago Essential hypertension   [place], Devonne Doughty, DO   2 years ago Essential hypertension   [place], DO      Future Appointments            In 1 month [place], Devonne Doughty, DO [address] [facility], [place] in normal range and within 360 days    Calcium  Date Value Ref Range Status  06/13/2020 9.9 8.6 - 10.4 mg/dL Final         Passed - Cr in normal range and within 360 days    Creat  Date Value Ref Range Status  06/13/2020 0.74 0.50 - 1.05 mg/dL Final    Comment:    For patients >24 years of age, the reference limit for Creatinine is approximately 13% higher for people identified as African-American. .          Passed - K in normal range and within 360 days    Potassium  Date Value Ref Range Status  06/13/2020 4.4 3.5 - 5.3 mmol/L Final         Passed - Na in normal range and within 360 days    Sodium  Date Value Ref Range Status  06/13/2020 138 135 - 146 mmol/L Final  05/14/2018 138 134 - 144 mmol/L Final         Passed - Last BP in normal range    BP Readings from Last 1 Encounters:  06/13/20 130/80

## 2021-06-04 NOTE — Progress Notes (Signed)
PCP: Olin Hauser, DO   Chief Complaint  Patient presents with   Gynecologic Exam    No concerns    HPI:      Leslie Ross is a 56 y.o. G1P1001 whose LMP was Patient's last menstrual period was 02/24/2021 (exact date)., presents today for her annual examination.  Her menses are infrequent with POPs. Had 7 days mod flow 11/22, no dysmen. Few days light spotting 1/23. Started POPs 2021 due to AUB. S/p hysteroscopy/D&C with Dr. Georgianne Fick with endometrial polyps. Hx of leio, ovar cysts. She does have some vasomotor sx.   Sex activity: single partner, contraception - oral progesterone-only contraceptive. She does not have vaginal dryness.  Last Pap: 05/14/18 Results were: no abnormalities /neg HPV DNA. No hx of abn paps.  Hx of STDs: none  Last mammogram: 09/20/19 Results were: normal--routine follow-up in 12 months There is no FH of breast cancer. There is no FH of ovarian cancer. The patient does do self-breast exams.  Colonoscopy: 2019 with polyp, done by Dr. Allen Norris,  Repeat due after 10 years.   Tobacco use: The patient denies current or previous tobacco use. Alcohol use: social drinker No drug use Exercise: moderately active  She does get adequate calcium and Vitamin D in her diet.  Labs with PCP.  Pt with anxiety, takes sertraline 50 mg daily with sx control. Needs RF.   Patient Active Problem List   Diagnosis Date Noted   Acute non-recurrent sinusitis 02/02/2020   Cough 02/02/2020   Abnormal uterine bleeding (AUB)    Thickened endometrium    Cervical polyp    Menometrorrhagia 05/23/2019   Essential hypertension 07/13/2018   Elevated hemoglobin A1c 07/13/2018   Special screening for malignant neoplasms, colon    Polyp of sigmoid colon    Anxiety 04/17/2017   Morbid obesity with BMI of 40.0-44.9, adult ([place]) 04/17/2017    Past Surgical History:  Procedure Laterality Date   CESAREAN SECTION  12/1997   COLONOSCOPY WITH PROPOFOL N/A 06/30/2017    Procedure: COLONOSCOPY WITH PROPOFOL;  Surgeon: [name], MD;  Location: [facility] ENDOSCOPY;  Service: Endoscopy;  Laterality: N/A;   HYSTEROSCOPY WITH D & C N/A 07/26/2019   Procedure: DILATATION AND CURETTAGE /HYSTEROSCOPY;  Surgeon: Malachy Mood, MD;  Location: [facility] ORS;  Service: Gynecology;  Laterality: N/A;   INTRAUTERINE DEVICE (IUD) INSERTION N/A 07/26/2019   Procedure: INTRAUTERINE DEVICE (IUD) INSERTION - Mirena;  Surgeon: Malachy Mood, MD;  Location: [facility] ORS;  Service: Gynecology;  Laterality: N/A;   TONSILLECTOMY      Family History  Problem Relation Age of Onset   Dementia Mother 72       Alzheimers   Heart disease Father    Aneurysm Father    Diabetes Maternal Grandmother        type 1   Heart disease Maternal Grandmother    Heart disease Paternal Grandmother    Breast cancer Neg Hx     Social History   Socioeconomic History   Marital status: Married    Spouse name: Dominica Severin   Number of children: 1   Years of education: 12+   Highest education level: Not on file  Occupational History   Not on file  Tobacco Use   Smoking status: Former   Smokeless tobacco: Never   Tobacco comments:    quit smoking in 2009  Vaping Use   Vaping Use: Never used  Substance and Sexual Activity   Alcohol use: Yes    Comment:  occasionally   Drug use: No   Sexual activity: Yes    Partners: Male    Birth control/protection: Pill  Other Topics Concern   Not on file  Social History Narrative   Not on file   Social Determinants of Health   Financial Resource Strain: Not on file  Food Insecurity: Not on file  Transportation Needs: Not on file  Physical Activity: Not on file  Stress: Not on file  Social Connections: Not on file  Intimate Partner Violence: Not on file     Current Outpatient Medications:    b complex vitamins capsule, Take 1 capsule by mouth daily., Disp: , Rfl:    Calcium Carb-Cholecalciferol (CALCIUM 600+D3 PO), Take 1 tablet by mouth daily., Disp:  , Rfl:    cetirizine (ZYRTEC) 10 MG tablet, Take 10 mg by mouth at bedtime. , Disp: , Rfl:    fluticasone (FLONASE) 50 MCG/ACT nasal spray, Place 1 spray into both nostrils at bedtime., Disp: , Rfl:    Garlic 409 MG TBEC, Take 1 tablet by mouth daily., Disp: , Rfl:    hydrochlorothiazide (HYDRODIURIL) 12.5 MG tablet, TAKE 1 TABLET BY MOUTH DAILY, Disp: 60 tablet, Rfl: 0   ibuprofen (ADVIL) 600 MG tablet, Take 1 tablet (600 mg total) by mouth every 6 (six) hours as needed., Disp: 60 tablet, Rfl: 3   magnesium oxide (MAG-OX) 400 MG tablet, Take 400 mg by mouth at bedtime. , Disp: , Rfl:    Multiple Vitamin (MULTIVITAMIN WITH MINERALS) TABS tablet, Take 1 tablet by mouth daily., Disp: , Rfl:    Omega-3 Fatty Acids (OMEGA 3 500) 500 MG CAPS, Take 500 mg by mouth daily., Disp: , Rfl:    TURMERIC PO, Take 1,000 mg by mouth daily., Disp: , Rfl:    Collagen Hydrolysate, Bovine, POWD, Take 1 Scoop by mouth daily. WITH COFFEE (Patient not taking: Reported on 06/05/2021), Disp: , Rfl:    norethindrone (MICRONOR) 0.35 MG tablet, Take 1 tablet (0.35 mg total) by mouth daily., Disp: 84 tablet, Rfl: 3   sertraline (ZOLOFT) 50 MG tablet, Take 1 tablet (50 mg total) by mouth daily., Disp: 90 tablet, Rfl: 3     ROS:  Review of Systems  Constitutional:  Negative for fatigue, fever and unexpected weight change.  Respiratory:  Negative for cough, shortness of breath and wheezing.   Cardiovascular:  Negative for chest pain, palpitations and leg swelling.  Gastrointestinal:  Negative for blood in stool, constipation, diarrhea, nausea and vomiting.  Endocrine: Negative for cold intolerance, heat intolerance and polyuria.  Genitourinary:  Negative for dyspareunia, dysuria, flank pain, frequency, genital sores, hematuria, menstrual problem, pelvic pain, urgency, vaginal bleeding, vaginal discharge and vaginal pain.  Musculoskeletal:  Negative for back pain, joint swelling and myalgias.  Skin:  Negative for rash.   Neurological:  Negative for dizziness, syncope, light-headedness, numbness and headaches.  Hematological:  Negative for adenopathy.  Psychiatric/Behavioral:  Negative for agitation, confusion, sleep disturbance and suicidal ideas. The patient is not nervous/anxious.   BREAST: No symptoms    Objective: BP (!) 144/80    Ht '5\' 7"'$  (1.702 m)    Wt 279 lb (126.6 kg)    LMP 02/24/2021 (Exact Date)    BMI 43.70 kg/m    Physical Exam Constitutional:      Appearance: She is well-developed.  Genitourinary:     Vulva normal.     Right Labia: No rash, tenderness or lesions.    Left Labia: No tenderness, lesions or rash.  No vaginal discharge, erythema or tenderness.      Right Adnexa: not tender and no mass present.    Left Adnexa: not tender and no mass present.    No cervical friability or polyp.     Uterus is not enlarged or tender.  Breasts:    Right: No mass, nipple discharge, skin change or tenderness.     Left: No mass, nipple discharge, skin change or tenderness.  Neck:     Thyroid: No thyromegaly.  Cardiovascular:     Rate and Rhythm: Normal rate and regular rhythm.     Heart sounds: Normal heart sounds. No murmur heard. Pulmonary:     Effort: Pulmonary effort is normal.     Breath sounds: Normal breath sounds.  Abdominal:     Palpations: Abdomen is soft.     Tenderness: There is no abdominal tenderness. There is no guarding or rebound.  Musculoskeletal:        General: Normal range of motion.     Cervical back: Normal range of motion.  Lymphadenopathy:     Cervical: No cervical adenopathy.  Neurological:     General: No focal deficit present.     Mental Status: She is alert and oriented to person, place, and time.     Cranial Nerves: No cranial nerve deficit.  Skin:    General: Skin is warm and dry.  Psychiatric:        Mood and Affect: Mood normal.        Behavior: Behavior normal.        Thought Content: Thought content normal.        Judgment: Judgment  normal.  Vitals reviewed.    Assessment/Plan:  Encounter for annual routine gynecological examination  Encounter for surveillance of contraceptive pills - Plan: norethindrone (MICRONOR) 0.35 MG tablet; Rx RF. Will re-eval need for Rx next yr.   Encounter for screening mammogram for malignant neoplasm of breast - Plan: MM 3D SCREEN BREAST BILATERAL; pt to schedule mammo  Anxiety - Plan: sertraline (ZOLOFT) 50 MG tablet; Rx RF. Doing well.    Meds ordered this encounter  Medications   sertraline (ZOLOFT) 50 MG tablet    Sig: Take 1 tablet (50 mg total) by mouth daily.    Dispense:  90 tablet    Refill:  3    Order Specific Question:   Supervising Provider    Answer:   Gae Dry [[id number]]   norethindrone (MICRONOR) 0.35 MG tablet    Sig: Take 1 tablet (0.35 mg total) by mouth daily.    Dispense:  84 tablet    Refill:  3    Order Specific Question:   Supervising Provider    Answer:   Gae Dry [[id number]]            GYN counsel breast self exam, mammography screening, menopause, adequate intake of calcium and vitamin D, diet and exercise    F/U  Return in about 1 year (around 06/06/2022).  Leslie Elza B. Kori Colin, PA-C 06/05/2021 9:23 AM

## 2021-06-05 ENCOUNTER — Ambulatory Visit (INDEPENDENT_AMBULATORY_CARE_PROVIDER_SITE_OTHER): Payer: BC Managed Care – PPO | Admitting: Obstetrics and Gynecology

## 2021-06-05 ENCOUNTER — Encounter: Payer: Self-pay | Admitting: Obstetrics and Gynecology

## 2021-06-05 ENCOUNTER — Other Ambulatory Visit: Payer: Self-pay

## 2021-06-05 VITALS — BP 144/80 | Ht 67.0 in | Wt 279.0 lb

## 2021-06-05 DIAGNOSIS — Z1231 Encounter for screening mammogram for malignant neoplasm of breast: Secondary | ICD-10-CM

## 2021-06-05 DIAGNOSIS — Z01419 Encounter for gynecological examination (general) (routine) without abnormal findings: Secondary | ICD-10-CM

## 2021-06-05 DIAGNOSIS — Z3041 Encounter for surveillance of contraceptive pills: Secondary | ICD-10-CM

## 2021-06-05 DIAGNOSIS — F419 Anxiety disorder, unspecified: Secondary | ICD-10-CM | POA: Diagnosis not present

## 2021-06-05 MED ORDER — SERTRALINE HCL 50 MG PO TABS: 50.0000 mg | ORAL_TABLET | Freq: Every day | ORAL | 3 refills | Status: DC

## 2021-06-05 MED ORDER — NORETHINDRONE 0.35 MG PO TABS: 1.0000 | ORAL_TABLET | Freq: Every day | ORAL | 3 refills | Status: DC

## 2021-06-05 NOTE — Patient Instructions (Addendum)
I value your feedback and you entrusting us with your care. If you get a [facility] patient survey, I would appreciate you taking the time to let us know about your experience today. Thank you!  [facility]: [phone]      

## 2021-06-14 ENCOUNTER — Ambulatory Visit (INDEPENDENT_AMBULATORY_CARE_PROVIDER_SITE_OTHER): Payer: BC Managed Care – PPO | Admitting: Family Medicine

## 2021-06-14 ENCOUNTER — Encounter: Payer: Self-pay | Admitting: Family Medicine

## 2021-06-14 ENCOUNTER — Other Ambulatory Visit: Payer: Self-pay

## 2021-06-14 VITALS — BP 132/88 | HR 78 | Ht 67.0 in | Wt 280.2 lb

## 2021-06-14 DIAGNOSIS — E7800 Pure hypercholesterolemia, unspecified: Secondary | ICD-10-CM

## 2021-06-14 DIAGNOSIS — I10 Essential (primary) hypertension: Secondary | ICD-10-CM | POA: Diagnosis not present

## 2021-06-14 DIAGNOSIS — Z6841 Body Mass Index (BMI) 40.0 and over, adult: Secondary | ICD-10-CM

## 2021-06-14 DIAGNOSIS — Z0000 Encounter for general adult medical examination without abnormal findings: Secondary | ICD-10-CM | POA: Diagnosis not present

## 2021-06-14 DIAGNOSIS — R7309 Other abnormal glucose: Secondary | ICD-10-CM | POA: Diagnosis not present

## 2021-06-14 MED ORDER — HYDROCHLOROTHIAZIDE 12.5 MG PO TABS: 12.5000 mg | ORAL_TABLET | Freq: Every day | ORAL | 3 refills | Status: DC

## 2021-06-14 NOTE — Patient Instructions (Addendum)
Thank you for coming to the office today. ? ?Shingrix shingles vaccine - 2 doses 2-6 months part, can cause flu like symptoms. Very effective. ? ?Keep up the good work! ? ?Labs on mychart ? ?DUE for FASTING BLOOD WORK (no food or drink after midnight before the lab appointment, only water or coffee without cream/sugar on the morning of) ? ?SCHEDULE "Lab Only" visit in the morning at the clinic for lab draw in 1 YEAR ? ?- Make sure Lab Only appointment is at about 1 week before your next appointment, so that results will be available ? ?For Lab Results, once available within 2-3 days of blood draw, you can can log in to MyChart online to view your results and a brief explanation. Also, we can discuss results at next follow-up visit. ? ? ? ?Please schedule a Follow-up Appointment to: Return in about 1 year (around 06/15/2022) for 1 year Annual Physical fasting lab AFTER (8am requested). ? ?If you have any other questions or concerns, please feel free to call the office or send a message through [facility]. You may also schedule an earlier appointment if necessary. ? ?Additionally, you may be receiving a survey about your experience at our office within a few days to 1 week by e-mail or mail. We value your feedback. ? ?Nobie Putnam, DO ?[place] ?

## 2021-06-14 NOTE — Assessment & Plan Note (Signed)
Improved HTN control ?No known complications ?Morbid obesity ?On OCP per GYN ?  ?Plan:  ?1. Continue/refill HCTZ 12.'5mg'$  daily - for BP and edema ?2. Encourage improved lifestyle - low sodium diet, regular exercise ?3. monitor BP outside office, with new cuff for arm, bring readings to next visit, if persistently >140/90 or new symptoms notify office sooner ?

## 2021-06-14 NOTE — Progress Notes (Signed)
? ?Subjective:  ? ? Patient ID: Leslie Ross, female    DOB: 12-28-1965, 56 y.o.   MRN: [id number] ? ?Leslie Ross is a 56 y.o. female presenting on 06/14/2021 for Annual Exam ? ? ?HPI ? ?Here for Annual Physical and Lab Review ? ?CHRONIC HTN ?Morbid Obesity BMI >43 ?  ?Home BP log has been controlled. ?Current Meds - HCTZ 12.'5mg'$  needs refill ?- Taking supplement Garlique ?Lifestyle: ?- Diet: reducing sodium salt in diet ?- Exercise: working on improving exercise, has standing desk, often staying active, goal to return to gym ?Admits occasional edema in feet/ankles at times, worse if prolong sitting ?Denies CP, dyspnea, HA, edema, dizziness / lightheadedness ?  ?Irregular Menstrual Cycle ?Followed by [facility], last visit 05/2021 ?Recent mammogram ordered scheduled for 07/11/21 ?On OCP ?  ?Anxiety ?On Sertraline low dose '50mg'$  daily, already refilled by GYN. Denies depression. ?  ?Health Maintenance: ? ?Up to date colonoscopy on file in chart. Next due 06/2027. (last done 06/2017, good for 10 years) ? ?Pap smear done OBGYN previously 2020, next due 5 years approx 2025. ([facility]) ?   ?Not up to date on COVID19 vaccine. ? ? ? ?Depression screen [facility] 2/9 06/14/2021 06/13/2020 06/04/2020  ?Decreased Interest 0 0 0  ?Down, Depressed, Hopeless 0 0 0  ?PHQ - 2 Score 0 0 0  ?Altered sleeping 2 - 1  ?Tired, decreased energy 0 - 1  ?Change in appetite 0 - 1  ?Feeling bad or failure about yourself  0 - 1  ?Trouble concentrating 0 - 0  ?Moving slowly or fidgety/restless 0 - 0  ?Suicidal thoughts 0 - 0  ?PHQ-9 Score 2 - 4  ?Difficult doing work/chores Not difficult at all Not difficult at all Not difficult at all  ? ? ?Past Medical History:  ?Diagnosis Date  ? Allergic rhinitis   ? Anxiety   ? Candidal vaginitis   ? History of MRSA infection   ? Hypertension   ? Obesity   ? BMI 41.8 04/17/2017  ? Sleep apnea   ? LOST WEIGHT STOPPED USING  ? ?Past Surgical History:  ?Procedure Laterality Date  ? CESAREAN SECTION  12/1997  ?  COLONOSCOPY WITH PROPOFOL N/A 06/30/2017  ? Procedure: COLONOSCOPY WITH PROPOFOL;  Surgeon: [name], MD;  Location: [facility] ENDOSCOPY;  Service: Endoscopy;  Laterality: N/A;  ? HYSTEROSCOPY WITH D & C N/A 07/26/2019  ? Procedure: DILATATION AND CURETTAGE /HYSTEROSCOPY;  Surgeon: Malachy Mood, MD;  Location: [facility] ORS;  Service: Gynecology;  Laterality: N/A;  ? INTRAUTERINE DEVICE (IUD) INSERTION N/A 07/26/2019  ? Procedure: INTRAUTERINE DEVICE (IUD) INSERTION - Mirena;  Surgeon: Malachy Mood, MD;  Location: [facility] ORS;  Service: Gynecology;  Laterality: N/A;  ? TONSILLECTOMY    ? ?Social History  ? ?Socioeconomic History  ? Marital status: Married  ?  Spouse name: Dominica Severin  ? Number of children: 1  ? Years of education: 12+  ? Highest education level: Not on file  ?Occupational History  ? Not on file  ?Tobacco Use  ? Smoking status: Former  ? Smokeless tobacco: Never  ? Tobacco comments:  ?  quit smoking in 2009  ?Vaping Use  ? Vaping Use: Never used  ?Substance and Sexual Activity  ? Alcohol use: Yes  ?  Comment: occasionally  ? Drug use: No  ? Sexual activity: Yes  ?  Partners: Male  ?  Birth control/protection: Pill  ?Other Topics Concern  ? Not on file  ?Social History Narrative  ?  Not on file  ? ?Social Determinants of Health  ? ?Financial Resource Strain: Not on file  ?Food Insecurity: Not on file  ?Transportation Needs: Not on file  ?Physical Activity: Not on file  ?Stress: Not on file  ?Social Connections: Not on file  ?Intimate Partner Violence: Not on file  ? ?Family History  ?Problem Relation Age of Onset  ? Dementia Mother 4  ?     Alzheimers  ? Heart disease Father   ? Aneurysm Father   ? Diabetes Maternal Grandmother   ?     type 1  ? Heart disease Maternal Grandmother   ? Heart disease Paternal Grandmother   ? Breast cancer Neg Hx   ? ?Current Outpatient Medications on File Prior to Visit  ?Medication Sig  ? b complex vitamins capsule Take 1 capsule by mouth daily.  ? Calcium Carb-Cholecalciferol  (CALCIUM 600+D3 PO) Take 1 tablet by mouth daily.  ? cetirizine (ZYRTEC) 10 MG tablet Take 10 mg by mouth at bedtime.   ? fluticasone (FLONASE) 50 MCG/ACT nasal spray Place 1 spray into both nostrils at bedtime.  ? Garlic 161 MG TBEC Take 1 tablet by mouth daily.  ? ibuprofen (ADVIL) 600 MG tablet Take 1 tablet (600 mg total) by mouth every 6 (six) hours as needed.  ? magnesium oxide (MAG-OX) 400 MG tablet Take 400 mg by mouth at bedtime.   ? Multiple Vitamin (MULTIVITAMIN WITH MINERALS) TABS tablet Take 1 tablet by mouth daily.  ? norethindrone (MICRONOR) 0.35 MG tablet Take 1 tablet (0.35 mg total) by mouth daily.  ? Omega-3 Fatty Acids (OMEGA 3 500) 500 MG CAPS Take 500 mg by mouth daily.  ? sertraline (ZOLOFT) 50 MG tablet Take 1 tablet (50 mg total) by mouth daily.  ? TURMERIC PO Take 1,000 mg by mouth daily.  ? Collagen Hydrolysate, Bovine, POWD Take 1 Scoop by mouth daily. WITH COFFEE (Patient not taking: Reported on 06/05/2021)  ? ?No current facility-administered medications on file prior to visit.  ? ? ?Review of Systems  ?Constitutional:  Negative for activity change, appetite change, chills, diaphoresis, fatigue and fever.  ?HENT:  Negative for congestion and hearing loss.   ?Eyes:  Negative for visual disturbance.  ?Respiratory:  Negative for cough, chest tightness, shortness of breath and wheezing.   ?Cardiovascular:  Negative for chest pain, palpitations and leg swelling.  ?Gastrointestinal:  Negative for abdominal pain, constipation, diarrhea, nausea and vomiting.  ?Genitourinary:  Negative for dysuria, frequency and hematuria.  ?Musculoskeletal:  Negative for arthralgias and neck pain.  ?Skin:  Negative for rash.  ?Neurological:  Negative for dizziness, weakness, light-headedness, numbness and headaches.  ?Hematological:  Negative for adenopathy.  ?Psychiatric/Behavioral:  Negative for behavioral problems, dysphoric mood and sleep disturbance.   ?Per HPI unless specifically indicated above ? ? ?    ?Objective:  ?  ?BP 132/88   Pulse 78   Ht '5\' 7"'$  (1.702 m)   Wt 280 lb 3.2 oz (127.1 kg)   SpO2 98%   BMI 43.89 kg/m?   ?Wt Readings from Last 3 Encounters:  ?06/14/21 280 lb 3.2 oz (127.1 kg)  ?06/05/21 279 lb (126.6 kg)  ?06/13/20 271 lb (122.9 kg)  ?  ?Physical Exam ?Vitals and nursing note reviewed.  ?Constitutional:   ?   General: She is not in acute distress. ?   Appearance: She is well-developed. She is not diaphoretic.  ?   Comments: Well-appearing, comfortable, cooperative  ?HENT:  ?   Head: Normocephalic  and atraumatic.  ?Eyes:  ?   General:     ?   Right eye: No discharge.     ?   Left eye: No discharge.  ?   Conjunctiva/sclera: Conjunctivae normal.  ?   Pupils: Pupils are equal, round, and reactive to light.  ?Neck:  ?   Thyroid: No thyromegaly.  ?   Vascular: No carotid bruit.  ?Cardiovascular:  ?   Rate and Rhythm: Normal rate and regular rhythm.  ?   Pulses: Normal pulses.  ?   Heart sounds: Normal heart sounds. No murmur heard. ?Pulmonary:  ?   Effort: Pulmonary effort is normal. No respiratory distress.  ?   Breath sounds: Normal breath sounds. No wheezing or rales.  ?Abdominal:  ?   General: Bowel sounds are normal. There is no distension.  ?   Palpations: Abdomen is soft. There is no mass.  ?   Tenderness: There is no abdominal tenderness.  ?Musculoskeletal:     ?   General: No tenderness. Normal range of motion.  ?   Cervical back: Normal range of motion and neck supple.  ?   Right lower leg: No edema.  ?   Left lower leg: No edema.  ?   Comments: Upper / Lower Extremities: ?- Normal muscle tone, strength bilateral upper extremities 5/5, lower extremities 5/5  ?Lymphadenopathy:  ?   Cervical: No cervical adenopathy.  ?Skin: ?   General: Skin is warm and dry.  ?   Findings: No erythema or rash.  ?Neurological:  ?   Mental Status: She is alert and oriented to person, place, and time.  ?   Comments: Distal sensation intact to light touch all extremities  ?Psychiatric:     ?   Mood and Affect:  Mood normal.     ?   Behavior: Behavior normal.     ?   Thought Content: Thought content normal.  ?   Comments: Well groomed, good eye contact, normal speech and thoughts  ? ? ? ? ?Results for orders placed or performed

## 2021-06-15 LAB — CBC WITH DIFFERENTIAL/PLATELET
Absolute Monocytes: 490 cells/uL (ref 200–950)
Basophils Absolute: 70 cells/uL (ref 0–200)
Basophils Relative: 1 %
Eosinophils Absolute: 441 cells/uL (ref 15–500)
Eosinophils Relative: 6.3 %
HCT: 45.4 % — ABNORMAL HIGH (ref 35.0–45.0)
Hemoglobin: 14.8 g/dL (ref 11.7–15.5)
Lymphs Abs: 1932 cells/uL (ref 850–3900)
MCH: 30.2 pg (ref 27.0–33.0)
MCHC: 32.6 g/dL (ref 32.0–36.0)
MCV: 92.7 fL (ref 80.0–100.0)
MPV: 10.2 fL (ref 7.5–12.5)
Monocytes Relative: 7 %
Neutro Abs: 4067 cells/uL (ref 1500–7800)
Neutrophils Relative %: 58.1 %
Platelets: 375 10*3/uL (ref 140–400)
RBC: 4.9 10*6/uL (ref 3.80–5.10)
RDW: 11.8 % (ref 11.0–15.0)
Total Lymphocyte: 27.6 %
WBC: 7 10*3/uL (ref 3.8–10.8)

## 2021-06-15 LAB — COMPLETE METABOLIC PANEL WITH GFR
AG Ratio: 1.8 (calc) (ref 1.0–2.5)
ALT: 17 U/L (ref 6–29)
AST: 16 U/L (ref 10–35)
Albumin: 4.4 g/dL (ref 3.6–5.1)
Alkaline phosphatase (APISO): 51 U/L (ref 37–153)
BUN: 17 mg/dL (ref 7–25)
CO2: 25 mmol/L (ref 20–32)
Calcium: 9.6 mg/dL (ref 8.6–10.4)
Chloride: 108 mmol/L (ref 98–110)
Creat: 0.7 mg/dL (ref 0.50–1.03)
Globulin: 2.4 g/dL (calc) (ref 1.9–3.7)
Glucose, Bld: 118 mg/dL — ABNORMAL HIGH (ref 65–99)
Potassium: 4.3 mmol/L (ref 3.5–5.3)
Sodium: 142 mmol/L (ref 135–146)
Total Bilirubin: 0.5 mg/dL (ref 0.2–1.2)
Total Protein: 6.8 g/dL (ref 6.1–8.1)
eGFR: 103 mL/min/{1.73_m2} (ref >=60)

## 2021-06-15 LAB — HEMOGLOBIN A1C
Hgb A1c MFr Bld: 6.1 % of total Hgb — ABNORMAL HIGH (ref <5.7)
Mean Plasma Glucose: 128 mg/dL
eAG (mmol/L): 7.1 mmol/L

## 2021-06-15 LAB — LIPID PANEL
Cholesterol: 181 mg/dL (ref <200)
HDL: 46 mg/dL — ABNORMAL LOW (ref >=50)
LDL Cholesterol (Calc): 116 mg/dL (calc) — ABNORMAL HIGH
Non-HDL Cholesterol (Calc): 135 mg/dL (calc) — ABNORMAL HIGH (ref <130)
Total CHOL/HDL Ratio: 3.9 (calc) (ref <5.0)
Triglycerides: 88 mg/dL (ref <150)

## 2021-06-15 LAB — TSH: TSH: 1.64 mIU/L

## 2021-06-18 ENCOUNTER — Encounter: Payer: Self-pay | Admitting: Family Medicine

## 2021-07-02 ENCOUNTER — Telehealth: Payer: BC Managed Care – PPO | Admitting: Nurse Practitioner

## 2021-07-02 DIAGNOSIS — J069 Acute upper respiratory infection, unspecified: Secondary | ICD-10-CM | POA: Diagnosis not present

## 2021-07-02 DIAGNOSIS — R051 Acute cough: Secondary | ICD-10-CM

## 2021-07-02 MED ORDER — BENZONATATE 100 MG PO CAPS: 100.0000 mg | ORAL_CAPSULE | Freq: Three times a day (TID) | ORAL | 0 refills | Status: DC | PRN

## 2021-07-02 MED ORDER — ALBUTEROL SULFATE HFA 108 (90 BASE) MCG/ACT IN AERS: 2.0000 | INHALATION_SPRAY | Freq: Four times a day (QID) | RESPIRATORY_TRACT | 0 refills | Status: DC | PRN

## 2021-07-02 NOTE — Progress Notes (Signed)
We are sorry that you are not feeling well.  Here is how we plan to help! ? ?Based on your presentation I believe you most likely have A cough due to a virus.  This is called viral bronchitis and is best treated by rest, plenty of fluids and control of the cough.  You may use Ibuprofen or Tylenol as directed to help your symptoms.   ?  ?In addition you may use A prescription cough medication called Tessalon Perles '100mg'$ . You may take 1-2 capsules every 8 hours as needed for your cough. ? ?We will also prescribe an inhaler that will help with the wheezing associated with your cough.  ? ?Meds ordered this encounter  ?Medications  ? albuterol (VENTOLIN HFA) 108 (90 Base) MCG/ACT inhaler  ?  Sig: Inhale 2 puffs into the lungs every 6 (six) hours as needed for wheezing or shortness of breath.  ?  Dispense:  8 g  ?  Refill:  0  ?  ?Providers prescribe antibiotics to treat infections caused by bacteria. Antibiotics are very powerful in treating bacterial infections when they are used properly. To maintain their effectiveness, they should be used only when necessary. Overuse of antibiotics has resulted in the development of superbugs that are resistant to treatment!   ? ?After careful review of your answers, I would not recommend an antibiotic for your condition.  Antibiotics are not effective against viruses and therefore should not be used to treat them. Common examples of infections caused by viruses include colds and flu  ? ?From your responses in the eVisit questionnaire you describe inflammation in the upper respiratory tract which is causing a significant cough.  This is commonly called Bronchitis and has four common causes:   ?Allergies ?Viral Infections ?Acid Reflux ?Bacterial Infection ?Allergies, viruses and acid reflux are treated by controlling symptoms or eliminating the cause. An example might be a cough caused by taking certain blood pressure medications. You stop the cough by changing the medication.  Another example might be a cough caused by acid reflux. Controlling the reflux helps control the cough. ? ?USE OF BRONCHODILATOR ("RESCUE") INHALERS: ?There is a risk from using your bronchodilator too frequently.  The risk is that over-reliance on a medication which only relaxes the muscles surrounding the breathing tubes can reduce the effectiveness of medications prescribed to reduce swelling and congestion of the tubes themselves.  Although you feel brief relief from the bronchodilator inhaler, your asthma may actually be worsening with the tubes becoming more swollen and filled with mucus.  This can delay other crucial treatments, such as oral steroid medications. If you need to use a bronchodilator inhaler daily, several times per day, you should discuss this with your provider.  There are probably better treatments that could be used to keep your asthma under control.  ?   ?HOME CARE ?Only take medications as instructed by your medical team. ?Complete the entire course of an antibiotic. ?Drink plenty of fluids and get plenty of rest. ?Avoid close contacts especially the very young and the elderly ?Cover your mouth if you cough or cough into your sleeve. ?Always remember to wash your hands ?A steam or ultrasonic humidifier can help congestion.  ? ?GET HELP RIGHT AWAY IF: ?You develop worsening fever. ?You become short of breath ?You cough up blood. ?Your symptoms persist after you have completed your treatment plan ?MAKE SURE YOU  ?Understand these instructions. ?Will watch your condition. ?Will get help right away if you are not doing  well or get worse. ?  ? ?Thank you for choosing an e-visit. ? ?Your e-visit answers were reviewed by a board certified advanced clinical practitioner to complete your personal care plan. Depending upon the condition, your plan could have included both over the counter or prescription medications. ? ?Please review your pharmacy choice. Make sure the pharmacy is open so you can  pick up prescription now. If there is a problem, you may contact your provider through [employer] and have the prescription routed to another pharmacy.  Your safety is important to [place]. If you have drug allergies check your prescription carefully.  ? ?For the next 24 hours you can use MyChart to ask questions about today's visit, request a non-urgent call back, or ask for a work or school excuse. ?You will get an email in the next two days asking about your experience. I hope that your e-visit has been valuable and will speed your recovery.  ? ?I spent approximately 7 minutes reviewing the patient's history, current symptoms and coordinating their plan of care today.   ? ?Meds ordered this encounter  ?Medications  ? albuterol (VENTOLIN HFA) 108 (90 Base) MCG/ACT inhaler  ?  Sig: Inhale 2 puffs into the lungs every 6 (six) hours as needed for wheezing or shortness of breath.  ?  Dispense:  8 g  ?  Refill:  0  ? benzonatate (TESSALON) 100 MG capsule  ?  Sig: Take 1 capsule (100 mg total) by mouth 3 (three) times daily as needed.  ?  Dispense:  30 capsule  ?  Refill:  0  ?  ?

## 2021-07-11 ENCOUNTER — Ambulatory Visit
Admission: RE | Admit: 2021-07-11 | Discharge: 2021-07-11 | Disposition: A | Discharge status: Home / Self Care | Payer: BC Managed Care – PPO | Source: Ambulatory Visit | Attending: Obstetrics and Gynecology | Admitting: Obstetrics and Gynecology

## 2021-07-11 DIAGNOSIS — Z1231 Encounter for screening mammogram for malignant neoplasm of breast: Secondary | ICD-10-CM | POA: Diagnosis not present

## 2022-03-03 ENCOUNTER — Other Ambulatory Visit: Payer: Self-pay | Admitting: Obstetrics and Gynecology

## 2022-03-03 DIAGNOSIS — F419 Anxiety disorder, unspecified: Secondary | ICD-10-CM

## 2022-03-19 ENCOUNTER — Other Ambulatory Visit: Payer: Self-pay | Admitting: Family Medicine

## 2022-03-19 ENCOUNTER — Other Ambulatory Visit: Payer: Self-pay | Admitting: Obstetrics and Gynecology

## 2022-03-19 DIAGNOSIS — Z3041 Encounter for surveillance of contraceptive pills: Secondary | ICD-10-CM

## 2022-03-19 DIAGNOSIS — I10 Essential (primary) hypertension: Secondary | ICD-10-CM

## 2022-03-19 NOTE — Telephone Encounter (Signed)
Requested medication (s) are due for refill today: no  Requested medication (s) are on the active medication list: yes  Last refill:  06/14/21 #90 3 RF  Future visit scheduled: yes  Notes to clinic:  overdue lab work- pharmacy requesting further refills   Requested Prescriptions  Pending Prescriptions Disp Refills   hydrochlorothiazide (HYDRODIURIL) 12.5 MG tablet [Pharmacy Med Name: HYDROCHLOROTHIAZIDE 12.5 MG TAB] 90 tablet 3    Sig: TAKE 1 TABLET BY MOUTH DAILY     Cardiovascular: Diuretics - Thiazide Failed - 03/19/2022  8:12 AM      Failed - Cr in normal range and within 180 days    Creat  Date Value Ref Range Status  06/14/2021 0.70 0.50 - 1.03 mg/dL Final         Failed - K in normal range and within 180 days    Potassium  Date Value Ref Range Status  06/14/2021 4.3 3.5 - 5.3 mmol/L Final         Failed - Na in normal range and within 180 days    Sodium  Date Value Ref Range Status  06/14/2021 142 135 - 146 mmol/L Final  05/14/2018 138 134 - 144 mmol/L Final         Failed - Valid encounter within last 6 months    Recent Outpatient Visits           9 months ago Annual physical exam   [facility] Olin Hauser, DO   1 year ago Essential hypertension   [facility] Olin Hauser, DO   2 years ago Acute non-recurrent sinusitis, unspecified location   [facility], Lupita Raider, FNP   2 years ago Essential hypertension   [facility] Olin Hauser, DO   3 years ago Essential hypertension   [place], DO       Future Appointments             In 3 months Parks Ranger, Devonne Doughty, DO [facility], [place] BP in normal range    BP Readings from Last 1 Encounters:  06/14/21 132/88

## 2022-06-03 ENCOUNTER — Other Ambulatory Visit: Payer: Self-pay | Admitting: Obstetrics and Gynecology

## 2022-06-03 DIAGNOSIS — F419 Anxiety disorder, unspecified: Secondary | ICD-10-CM

## 2022-06-03 MED ORDER — SERTRALINE HCL 50 MG PO TABS: 50.0000 mg | ORAL_TABLET | Freq: Every day | ORAL | 0 refills | Status: DC

## 2022-06-03 NOTE — Progress Notes (Signed)
Rx RF sertraline till 4/24 annual

## 2022-06-10 ENCOUNTER — Other Ambulatory Visit: Payer: Self-pay | Admitting: Obstetrics and Gynecology

## 2022-06-10 DIAGNOSIS — F419 Anxiety disorder, unspecified: Secondary | ICD-10-CM

## 2022-06-19 ENCOUNTER — Other Ambulatory Visit: Payer: Self-pay | Admitting: Obstetrics and Gynecology

## 2022-06-19 ENCOUNTER — Ambulatory Visit (INDEPENDENT_AMBULATORY_CARE_PROVIDER_SITE_OTHER): Payer: No Typology Code available for payment source | Admitting: Family Medicine

## 2022-06-19 ENCOUNTER — Encounter: Payer: Self-pay | Admitting: Family Medicine

## 2022-06-19 VITALS — BP 134/78 | HR 66 | Ht 67.0 in | Wt 273.0 lb

## 2022-06-19 DIAGNOSIS — Z6841 Body Mass Index (BMI) 40.0 and over, adult: Secondary | ICD-10-CM

## 2022-06-19 DIAGNOSIS — Z0000 Encounter for general adult medical examination without abnormal findings: Secondary | ICD-10-CM

## 2022-06-19 DIAGNOSIS — I10 Essential (primary) hypertension: Secondary | ICD-10-CM | POA: Diagnosis not present

## 2022-06-19 DIAGNOSIS — R7309 Other abnormal glucose: Secondary | ICD-10-CM

## 2022-06-19 DIAGNOSIS — Z3041 Encounter for surveillance of contraceptive pills: Secondary | ICD-10-CM

## 2022-06-19 DIAGNOSIS — E7800 Pure hypercholesterolemia, unspecified: Secondary | ICD-10-CM

## 2022-06-19 MED ORDER — HYDROCHLOROTHIAZIDE 12.5 MG PO TABS: 12.5000 mg | ORAL_TABLET | Freq: Every day | ORAL | 3 refills | Status: DC

## 2022-06-19 NOTE — Progress Notes (Signed)
Subjective:    Patient ID: Leslie Ross, female    DOB: 15-Oct-1965, 57 y.o.   MRN: [id number]  Leslie Ross is a 57 y.o. female presenting on 06/19/2022 for Annual Exam   HPI  Here for Annual Physical and Lab Review  Pre-Diabetes Elevated A1c Last lab A1c 6.1, prior 5.7 to 5.8 She has reduced caloric intake  Restarted gym 2-3 days a week and goal to get to 4-5 days per week, mix of strength machines and cardio   CHRONIC HTN Morbid Obesity BMI >42   Home BP log has been controlled. Current Meds - HCTZ 12.5mg  needs refill - Taking supplement [place] Lifestyle: - Diet: reducing sodium salt in diet - Exercise: working on improving exercise now at gym regularly Admits occasional edema in feet/ankles at times, worse if prolong sitting Denies CP, dyspnea, HA, edema, dizziness / lightheadedness   Irregular Menstrual Cycle Followed by OBGYN On OCP   Anxiety Caregiver for mother with Alzheimer's sisters help out as well Doing well with Sertraline 50mg  daily helps manage her daily anxiety and helps with sleep, turns mind off so she can rest and not having any depression symptoms or prior diagnosis   Health Maintenance:  Mammogram 06/2021, at GYN update mammogram yearly.   Up to date colonoscopy on file in chart. Next due 06/2027. (last done 06/2017, good for 10 years)   Pap smear done OBGYN previously 2020, next due 5 years approx 2025. ([facility])    Not up to date on COVID19 vaccine.       06/19/2022    9:50 AM 06/14/2021    8:12 AM 06/13/2020    8:19 AM  Depression screen PHQ 2/9  Decreased Interest 0 0 0  Down, Depressed, Hopeless 0 0 0  PHQ - 2 Score 0 0 0  Altered sleeping 1 2   Tired, decreased energy 1 0   Change in appetite 0 0   Feeling bad or failure about yourself  0 0   Trouble concentrating 0 0   Moving slowly or fidgety/restless 0 0   Suicidal thoughts 0 0   PHQ-9 Score 2 2   Difficult doing work/chores Not difficult at all Not difficult at  all Not difficult at all      06/19/2022    9:51 AM 06/14/2021    8:13 AM 06/04/2020   10:31 AM 07/13/2018    3:23 PM  GAD 7 : Generalized Anxiety Score  Nervous, Anxious, on Edge 1 2 2 2   Control/stop worrying 0 0 1 2  Worry too much - different things 1 1 1 2   Trouble relaxing 1 1 2 2   Restless 0 0 0 1  Easily annoyed or irritable 0 1 2 2   Afraid - awful might happen 0 0 0 3  Total GAD 7 Score 3 5 8 14   Anxiety Difficulty Not difficult at all Not difficult at all Not difficult at all Not difficult at all      Past Medical History:  Diagnosis Date   Allergic rhinitis    Anxiety    Candidal vaginitis    History of MRSA infection    Hypertension    Obesity    BMI 41.8 04/17/2017   Sleep apnea    LOST WEIGHT STOPPED USING   Past Surgical History:  Procedure Laterality Date   CESAREAN SECTION  12/1997   COLONOSCOPY WITH PROPOFOL N/A 06/30/2017   Procedure: COLONOSCOPY WITH PROPOFOL;  Surgeon: [name], MD;  Location:  [facility];  Service: Endoscopy;  Laterality: N/A;   HYSTEROSCOPY WITH D & C N/A 07/26/2019   Procedure: DILATATION AND CURETTAGE /HYSTEROSCOPY;  Surgeon: Malachy Mood, MD;  Location: [facility];  Service: Gynecology;  Laterality: N/A;   INTRAUTERINE DEVICE (IUD) INSERTION N/A 07/26/2019   Procedure: INTRAUTERINE DEVICE (IUD) INSERTION - Mirena;  Surgeon: Malachy Mood, MD;  Location: [facility];  Service: Gynecology;  Laterality: N/A;   TONSILLECTOMY     Social History   Socioeconomic History   Marital status: Married    Spouse name: Dominica Severin   Number of children: 1   Years of education: 12+   Highest education level: Not on file  Occupational History   Not on file  Tobacco Use   Smoking status: Former   Smokeless tobacco: Never   Tobacco comments:    quit smoking in 2009  Vaping Use   Vaping Use: Never used  Substance and Sexual Activity   Alcohol use: Yes    Comment: occasionally   Drug use: No   Sexual activity: Yes    Partners: Male     Birth control/protection: Pill  Other Topics Concern   Not on file  Social History Narrative   Not on file   Social Determinants of Health   Financial Resource Strain: Not on file  Food Insecurity: Not on file  Transportation Needs: Not on file  Physical Activity: Sufficiently Active (04/17/2017)   Exercise Vital Sign    Days of Exercise per Week: 5 days    Minutes of Exercise per Session: 60 min  Stress: Stress Concern Present (04/17/2017)   [place]    Feeling of Stress : To some extent  Social Connections: Somewhat Isolated (04/17/2017)   Social Connection and Isolation Panel [NHANES]    Frequency of Communication with Friends and Family: Three times a week    Frequency of Social Gatherings with Friends and Family: Never    Attends Religious Services: Never    Marine scientist or Organizations: No    Attends Archivist Meetings: Never    Marital Status: Married  Human resources officer Violence: Not At Risk (04/17/2017)   Humiliation, Afraid, Rape, and Kick questionnaire    Fear of Current or Ex-Partner: No    Emotionally Abused: No    Physically Abused: No    Sexually Abused: No   Family History  Problem Relation Age of Onset   Dementia Mother 47       Alzheimers   Heart disease Father    Aneurysm Father    Diabetes Maternal Grandmother        type 1   Heart disease Maternal Grandmother    Heart disease Paternal Grandmother    Breast cancer Neg Hx    Current Outpatient Medications on File Prior to Visit  Medication Sig   albuterol (VENTOLIN HFA) 108 (90 Base) MCG/ACT inhaler Inhale 2 puffs into the lungs every 6 (six) hours as needed for wheezing or shortness of breath.   b complex vitamins capsule Take 1 capsule by mouth daily.   Calcium Carb-Cholecalciferol (CALCIUM 600+D3 PO) Take 1 tablet by mouth daily.   cetirizine (ZYRTEC) 10 MG tablet Take 10 mg by mouth at bedtime.    fluticasone  (FLONASE) 50 MCG/ACT nasal spray Place 1 spray into both nostrils at bedtime.   Garlic 536 MG TBEC Take 1 tablet by mouth daily.   ibuprofen (ADVIL) 600 MG tablet Take 1 tablet (600 mg  total) by mouth every 6 (six) hours as needed.   magnesium oxide (MAG-OX) 400 MG tablet Take 400 mg by mouth at bedtime.    Multiple Vitamin (MULTIVITAMIN WITH MINERALS) TABS tablet Take 1 tablet by mouth daily.   Omega-3 Fatty Acids (OMEGA 3 500) 500 MG CAPS Take 500 mg by mouth daily.   sertraline (ZOLOFT) 50 MG tablet Take 1 tablet (50 mg total) by mouth daily.   TURMERIC PO Take 1,000 mg by mouth daily.   Collagen Hydrolysate, Bovine, POWD Take 1 Scoop by mouth daily. WITH COFFEE (Patient not taking: Reported on 06/05/2021)   No current facility-administered medications on file prior to visit.    Review of Systems  Constitutional:  Negative for activity change, appetite change, chills, diaphoresis, fatigue and fever.  HENT:  Negative for congestion and hearing loss.   Eyes:  Negative for visual disturbance.  Respiratory:  Negative for cough, chest tightness, shortness of breath and wheezing.   Cardiovascular:  Negative for chest pain, palpitations and leg swelling.  Gastrointestinal:  Negative for abdominal pain, constipation, diarrhea, nausea and vomiting.  Genitourinary:  Negative for dysuria, frequency and hematuria.  Musculoskeletal:  Negative for arthralgias and neck pain.  Skin:  Negative for rash.  Neurological:  Negative for dizziness, weakness, light-headedness, numbness and headaches.  Hematological:  Negative for adenopathy.  Psychiatric/Behavioral:  Negative for behavioral problems, dysphoric mood and sleep disturbance.    Per HPI unless specifically indicated above      Objective:    BP 134/78   Pulse 66   Ht 5\' 7"  (1.702 m)   Wt 273 lb (123.8 kg)   SpO2 98%   BMI 42.76 kg/m   Wt Readings from Last 3 Encounters:  06/19/22 273 lb (123.8 kg)  06/14/21 280 lb 3.2 oz (127.1 kg)   06/05/21 279 lb (126.6 kg)    Physical Exam Vitals and nursing note reviewed.  Constitutional:      General: She is not in acute distress.    Appearance: She is well-developed. She is obese. She is not diaphoretic.     Comments: Well-appearing, comfortable, cooperative  HENT:     Head: Normocephalic and atraumatic.  Eyes:     General:        Right eye: No discharge.        Left eye: No discharge.     Conjunctiva/sclera: Conjunctivae normal.     Pupils: Pupils are equal, round, and reactive to light.  Neck:     Thyroid: No thyromegaly.  Cardiovascular:     Rate and Rhythm: Normal rate and regular rhythm.     Pulses: Normal pulses.     Heart sounds: Normal heart sounds. No murmur heard. Pulmonary:     Effort: Pulmonary effort is normal. No respiratory distress.     Breath sounds: Normal breath sounds. No wheezing or rales.  Abdominal:     General: Bowel sounds are normal. There is no distension.     Palpations: Abdomen is soft. There is no mass.     Tenderness: There is no abdominal tenderness.  Musculoskeletal:        General: No tenderness. Normal range of motion.     Cervical back: Normal range of motion and neck supple.     Comments: Upper / Lower Extremities: - Normal muscle tone, strength bilateral upper extremities 5/5, lower extremities 5/5  Lymphadenopathy:     Cervical: No cervical adenopathy.  Skin:    General: Skin is warm and dry.  Findings: No erythema or rash.  Neurological:     Mental Status: She is alert and oriented to person, place, and time.     Comments: Distal sensation intact to light touch all extremities  Psychiatric:        Mood and Affect: Mood normal.        Behavior: Behavior normal.        Thought Content: Thought content normal.     Comments: Well groomed, good eye contact, normal speech and thoughts        Results for orders placed or performed in visit on 06/14/21  COMPLETE METABOLIC PANEL WITH GFR  Result Value Ref Range    Glucose, Bld 118 (H) 65 - 99 mg/dL   BUN 17 7 - 25 mg/dL   Creat 0.70 0.50 - 1.03 mg/dL   eGFR 103 > OR = 60 mL/min/1.11m2   BUN/Creatinine Ratio NOT APPLICABLE 6 - 22 (calc)   Sodium 142 135 - 146 mmol/L   Potassium 4.3 3.5 - 5.3 mmol/L   Chloride 108 98 - 110 mmol/L   CO2 25 20 - 32 mmol/L   Calcium 9.6 8.6 - 10.4 mg/dL   Total Protein 6.8 6.1 - 8.1 g/dL   Albumin 4.4 3.6 - 5.1 g/dL   Globulin 2.4 1.9 - 3.7 g/dL (calc)   AG Ratio 1.8 1.0 - 2.5 (calc)   Total Bilirubin 0.5 0.2 - 1.2 mg/dL   Alkaline phosphatase (APISO) 51 37 - 153 U/L   AST 16 10 - 35 U/L   ALT 17 6 - 29 U/L  CBC with Differential/Platelet  Result Value Ref Range   WBC 7.0 3.8 - 10.8 Thousand/uL   RBC 4.90 3.80 - 5.10 Million/uL   Hemoglobin 14.8 11.7 - 15.5 g/dL   HCT 45.4 (H) 35.0 - 45.0 %   MCV 92.7 80.0 - 100.0 fL   MCH 30.2 27.0 - 33.0 pg   MCHC 32.6 32.0 - 36.0 g/dL   RDW 11.8 11.0 - 15.0 %   Platelets 375 140 - 400 Thousand/uL   MPV 10.2 7.5 - 12.5 fL   Neutro Abs 4,067 1,500 - 7,800 cells/uL   Lymphs Abs 1,932 850 - 3,900 cells/uL   Absolute Monocytes 490 200 - 950 cells/uL   Eosinophils Absolute 441 15 - 500 cells/uL   Basophils Absolute 70 0 - 200 cells/uL   Neutrophils Relative % 58.1 %   Total Lymphocyte 27.6 %   Monocytes Relative 7.0 %   Eosinophils Relative 6.3 %   Basophils Relative 1.0 %  Lipid panel  Result Value Ref Range   Cholesterol 181 <200 mg/dL   HDL 46 (L) > OR = 50 mg/dL   Triglycerides 88 <150 mg/dL   LDL Cholesterol (Calc) 116 (H) mg/dL (calc)   Total CHOL/HDL Ratio 3.9 <5.0 (calc)   Non-HDL Cholesterol (Calc) 135 (H) <130 mg/dL (calc)  Hemoglobin A1c  Result Value Ref Range   Hgb A1c MFr Bld 6.1 (H) <5.7 % of total Hgb   Mean Plasma Glucose 128 mg/dL   eAG (mmol/L) 7.1 mmol/L  TSH  Result Value Ref Range   TSH 1.64 mIU/L      Assessment & Plan:   Problem List Items Addressed This Visit     Elevated hemoglobin A1c    Last lab A1c 6.1, elevated from prior  high 5 range Counseling on lifestyle, diet modification Continue exercise and weight loss Defer medication management at this time for pre diabetes  Relevant Orders   Hemoglobin A1c   Essential hypertension    Controlled HTN No known complications Morbid obesity On OCP per GYN   Plan:  1. Continue/refill HCTZ 12.5mg  daily - for BP and edema 2. Encourage improved lifestyle - low sodium diet, regular exercise 3. monitor BP outside office, with new cuff for arm, bring readings to next visit, if persistently >140/90 or new symptoms notify office sooner      Relevant Medications   hydrochlorothiazide (HYDRODIURIL) 12.5 MG tablet   Other Relevant Orders   COMPLETE METABOLIC PANEL WITH GFR   CBC with Differential/Platelet   Morbid obesity with BMI of 40.0-44.9, adult (HCC)    BMI >42 Weight loss in past 6 months with success Continue lifestyle management diet exercise      Relevant Orders   COMPLETE METABOLIC PANEL WITH GFR   Lipid panel   Other Visit Diagnoses     Annual physical exam    -  Primary   Relevant Orders   COMPLETE METABOLIC PANEL WITH GFR   CBC with Differential/Platelet   Lipid panel   Hemoglobin A1c   TSH   Elevated LDL cholesterol level       Relevant Orders   Lipid panel   TSH       Updated Health Maintenance information Fasting labs ordered today, pending Encouraged improvement to lifestyle with diet and exercise Goal of weight loss   Meds ordered this encounter  Medications   hydrochlorothiazide (HYDRODIURIL) 12.5 MG tablet    Sig: Take 1 tablet (12.5 mg total) by mouth daily.    Dispense:  90 tablet    Refill:  3    Future fills     Follow up plan: Return in about 1 year (around 06/19/2023) for 1 year Annual Physical AM apt fasting lab after.  Nobie Putnam, [facility] 06/19/2022, 8:53 AM

## 2022-06-19 NOTE — Patient Instructions (Addendum)
Thank you for coming to the office today.  Keep up the good work!  Refilled HCTZ 12.5mg  daily  Keep on Sertraline 50mg  daily  Labs today, stay tuned on lab results.   Please schedule a Follow-up Appointment to: Return in about 1 year (around 06/19/2023) for 1 year Annual Physical AM apt fasting lab after.  If you have any other questions or concerns, please feel free to call the office or send a message through [place]. You may also schedule an earlier appointment if necessary.  Additionally, you may be receiving a survey about your experience at our office within a few days to 1 week by e-mail or mail. We value your feedback.  Nobie Putnam, DO [place]

## 2022-06-19 NOTE — Assessment & Plan Note (Signed)
Controlled HTN No known complications Morbid obesity On OCP per GYN   Plan:  1. Continue/refill HCTZ 12.5mg  daily - for BP and edema 2. Encourage improved lifestyle - low sodium diet, regular exercise 3. monitor BP outside office, with new cuff for arm, bring readings to next visit, if persistently >140/90 or new symptoms notify office sooner

## 2022-06-19 NOTE — Assessment & Plan Note (Signed)
Last lab A1c 6.1, elevated from prior high 5 range Counseling on lifestyle, diet modification Continue exercise and weight loss Defer medication management at this time for pre diabetes

## 2022-06-19 NOTE — Assessment & Plan Note (Signed)
BMI >42 Weight loss in past 6 months with success Continue lifestyle management diet exercise

## 2022-06-20 LAB — COMPLETE METABOLIC PANEL WITH GFR
AG Ratio: 1.7 (calc) (ref 1.0–2.5)
ALT: 23 U/L (ref 6–29)
AST: 20 U/L (ref 10–35)
Albumin: 4.5 g/dL (ref 3.6–5.1)
Alkaline phosphatase (APISO): 53 U/L (ref 37–153)
BUN: 18 mg/dL (ref 7–25)
CO2: 27 mmol/L (ref 20–32)
Calcium: 10.2 mg/dL (ref 8.6–10.4)
Chloride: 105 mmol/L (ref 98–110)
Creat: 0.83 mg/dL (ref 0.50–1.03)
Globulin: 2.7 g/dL (calc) (ref 1.9–3.7)
Glucose, Bld: 116 mg/dL (ref 65–139)
Potassium: 5 mmol/L (ref 3.5–5.3)
Sodium: 141 mmol/L (ref 135–146)
Total Bilirubin: 0.5 mg/dL (ref 0.2–1.2)
Total Protein: 7.2 g/dL (ref 6.1–8.1)
eGFR: 84 mL/min/{1.73_m2} (ref >=60)

## 2022-06-20 LAB — CBC WITH DIFFERENTIAL/PLATELET
Absolute Monocytes: 422 cells/uL (ref 200–950)
Basophils Absolute: 68 cells/uL (ref 0–200)
Basophils Relative: 1.1 %
Eosinophils Absolute: 242 cells/uL (ref 15–500)
Eosinophils Relative: 3.9 %
HCT: 45.9 % — ABNORMAL HIGH (ref 35.0–45.0)
Hemoglobin: 14.9 g/dL (ref 11.7–15.5)
Lymphs Abs: 1581 cells/uL (ref 850–3900)
MCH: 30.5 pg (ref 27.0–33.0)
MCHC: 32.5 g/dL (ref 32.0–36.0)
MCV: 93.9 fL (ref 80.0–100.0)
MPV: 10.1 fL (ref 7.5–12.5)
Monocytes Relative: 6.8 %
Neutro Abs: 3887 cells/uL (ref 1500–7800)
Neutrophils Relative %: 62.7 %
Platelets: 411 10*3/uL — ABNORMAL HIGH (ref 140–400)
RBC: 4.89 10*6/uL (ref 3.80–5.10)
RDW: 11.8 % (ref 11.0–15.0)
Total Lymphocyte: 25.5 %
WBC: 6.2 10*3/uL (ref 3.8–10.8)

## 2022-06-20 LAB — HEMOGLOBIN A1C
Hgb A1c MFr Bld: 6.3 % of total Hgb — ABNORMAL HIGH (ref <5.7)
Mean Plasma Glucose: 134 mg/dL
eAG (mmol/L): 7.4 mmol/L

## 2022-06-20 LAB — LIPID PANEL
Cholesterol: 207 mg/dL — ABNORMAL HIGH (ref <200)
HDL: 49 mg/dL — ABNORMAL LOW (ref >=50)
LDL Cholesterol (Calc): 136 mg/dL (calc) — ABNORMAL HIGH
Non-HDL Cholesterol (Calc): 158 mg/dL (calc) — ABNORMAL HIGH (ref <130)
Total CHOL/HDL Ratio: 4.2 (calc) (ref <5.0)
Triglycerides: 112 mg/dL (ref <150)

## 2022-06-20 LAB — TSH: TSH: 1.44 mIU/L

## 2022-07-01 NOTE — Progress Notes (Unsigned)
PCP: Olin Hauser, DO   No chief complaint on file.   HPI:      Ms. Leslie Ross is a 57 y.o. G1P1001 whose LMP was No LMP recorded. (Menstrual status: Perimenopausal)., presents today for her annual examination.  Her menses are infrequent with POPs. Had 7 days mod flow 11/22, no dysmen. Few days light spotting 1/23. Started POPs 2021 due to AUB. S/p hysteroscopy/D&C with Dr. Georgianne Fick with endometrial polyps. Hx of leio, ovar cysts. She does have some vasomotor sx.   Sex activity: single partner, contraception - oral progesterone-only contraceptive. She does not have vaginal dryness.  Last Pap: 05/14/18 Results were: no abnormalities /neg HPV DNA. No hx of abn paps.  Hx of STDs: none  Last mammogram: 07/11/21 Results were: normal--routine follow-up in 12 months There is no FH of breast cancer. There is no FH of ovarian cancer. The patient does do self-breast exams.  Colonoscopy: 2019 with polyp, done by Dr. Allen Norris,  Repeat due after 10 years.   Tobacco use: The patient denies current or previous tobacco use. Alcohol use: social drinker No drug use Exercise: moderately active  She does get adequate calcium and Vitamin D in her diet.  Labs with PCP.  Pt with anxiety, takes sertraline 50 mg daily with sx control. Needs RF.   Patient Active Problem List   Diagnosis Date Noted   Acute non-recurrent sinusitis 02/02/2020   Cough 02/02/2020   Abnormal uterine bleeding (AUB)    Thickened endometrium    Cervical polyp    Menometrorrhagia 05/23/2019   Essential hypertension 07/13/2018   Elevated hemoglobin A1c 07/13/2018   Special screening for malignant neoplasms, colon    Polyp of sigmoid colon    Anxiety 04/17/2017   Morbid obesity with BMI of 40.0-44.9, adult 04/17/2017    Past Surgical History:  Procedure Laterality Date   CESAREAN SECTION  12/1997   COLONOSCOPY WITH PROPOFOL N/A 06/30/2017   Procedure: COLONOSCOPY WITH PROPOFOL;  Surgeon: [name], MD;   Location: [facility] ENDOSCOPY;  Service: Endoscopy;  Laterality: N/A;   HYSTEROSCOPY WITH D & C N/A 07/26/2019   Procedure: DILATATION AND CURETTAGE /HYSTEROSCOPY;  Surgeon: Malachy Mood, MD;  Location: [facility] ORS;  Service: Gynecology;  Laterality: N/A;   INTRAUTERINE DEVICE (IUD) INSERTION N/A 07/26/2019   Procedure: INTRAUTERINE DEVICE (IUD) INSERTION - Mirena;  Surgeon: Malachy Mood, MD;  Location: [facility] ORS;  Service: Gynecology;  Laterality: N/A;   TONSILLECTOMY      Family History  Problem Relation Age of Onset   Dementia Mother 25       Alzheimers   Heart disease Father    Aneurysm Father    Diabetes Maternal Grandmother        type 1   Heart disease Maternal Grandmother    Heart disease Paternal Grandmother    Breast cancer Neg Hx     Social History   Socioeconomic History   Marital status: Married    Spouse name: Dominica Severin   Number of children: 1   Years of education: 12+   Highest education level: Not on file  Occupational History   Not on file  Tobacco Use   Smoking status: Former   Smokeless tobacco: Never   Tobacco comments:    quit smoking in 2009  Vaping Use   Vaping Use: Never used  Substance and Sexual Activity   Alcohol use: Yes    Comment: occasionally   Drug use: No   Sexual activity: Yes  Partners: Male    Birth control/protection: Pill  Other Topics Concern   Not on file  Social History Narrative   Not on file   Social Determinants of Health   Financial Resource Strain: Not on file  Food Insecurity: Not on file  Transportation Needs: Not on file  Physical Activity: Sufficiently Active (04/17/2017)   Exercise Vital Sign    Days of Exercise per Week: 5 days    Minutes of Exercise per Session: 60 min  Stress: Stress Concern Present (04/17/2017)   [place]    Feeling of Stress : To some extent  Social Connections: Somewhat Isolated (04/17/2017)   Social Connection and  Isolation Panel [NHANES]    Frequency of Communication with Friends and Family: Three times a week    Frequency of Social Gatherings with Friends and Family: Never    Attends Religious Services: Never    Marine scientist or Organizations: No    Attends Archivist Meetings: Never    Marital Status: Married  Human resources officer Violence: Not At Risk (04/17/2017)   Humiliation, Afraid, Rape, and Kick questionnaire    Fear of Current or Ex-Partner: No    Emotionally Abused: No    Physically Abused: No    Sexually Abused: No     Current Outpatient Medications:    albuterol (VENTOLIN HFA) 108 (90 Base) MCG/ACT inhaler, Inhale 2 puffs into the lungs every 6 (six) hours as needed for wheezing or shortness of breath., Disp: 8 g, Rfl: 0   b complex vitamins capsule, Take 1 capsule by mouth daily., Disp: , Rfl:    Calcium Carb-Cholecalciferol (CALCIUM 600+D3 PO), Take 1 tablet by mouth daily., Disp: , Rfl:    cetirizine (ZYRTEC) 10 MG tablet, Take 10 mg by mouth at bedtime. , Disp: , Rfl:    Collagen Hydrolysate, Bovine, POWD, Take 1 Scoop by mouth daily. WITH COFFEE (Patient not taking: Reported on 06/05/2021), Disp: , Rfl:    fluticasone (FLONASE) 50 MCG/ACT nasal spray, Place 1 spray into both nostrils at bedtime., Disp: , Rfl:    Garlic [id number] MG TBEC, Take 1 tablet by mouth daily., Disp: , Rfl:    hydrochlorothiazide (HYDRODIURIL) 12.5 MG tablet, Take 1 tablet (12.5 mg total) by mouth daily., Disp: 90 tablet, Rfl: 3   ibuprofen (ADVIL) 600 MG tablet, Take 1 tablet (600 mg total) by mouth every 6 (six) hours as needed., Disp: 60 tablet, Rfl: 3   magnesium oxide (MAG-OX) 400 MG tablet, Take 400 mg by mouth at bedtime. , Disp: , Rfl:    Multiple Vitamin (MULTIVITAMIN WITH MINERALS) TABS tablet, Take 1 tablet by mouth daily., Disp: , Rfl:    norethindrone (INCASSIA) 0.35 MG tablet, TAKE ONE TABLET BY MOUTH EVERY DAY, Disp: 84 tablet, Rfl: 0   Omega-3 Fatty Acids (OMEGA 3 500) 500 MG CAPS,  Take 500 mg by mouth daily., Disp: , Rfl:    sertraline (ZOLOFT) 50 MG tablet, Take 1 tablet (50 mg total) by mouth daily., Disp: 90 tablet, Rfl: 0   TURMERIC PO, Take 1,000 mg by mouth daily., Disp: , Rfl:      ROS:  Review of Systems  Constitutional:  Negative for fatigue, fever and unexpected weight change.  Respiratory:  Negative for cough, shortness of breath and wheezing.   Cardiovascular:  Negative for chest pain, palpitations and leg swelling.  Gastrointestinal:  Negative for blood in stool, constipation, diarrhea, nausea and vomiting.  Endocrine: Negative  for cold intolerance, heat intolerance and polyuria.  Genitourinary:  Negative for dyspareunia, dysuria, flank pain, frequency, genital sores, hematuria, menstrual problem, pelvic pain, urgency, vaginal bleeding, vaginal discharge and vaginal pain.  Musculoskeletal:  Negative for back pain, joint swelling and myalgias.  Skin:  Negative for rash.  Neurological:  Negative for dizziness, syncope, light-headedness, numbness and headaches.  Hematological:  Negative for adenopathy.  Psychiatric/Behavioral:  Negative for agitation, confusion, sleep disturbance and suicidal ideas. The patient is not nervous/anxious.    BREAST: No symptoms    Objective: There were no vitals taken for this visit.   Physical Exam Constitutional:      Appearance: She is well-developed.  Genitourinary:     Vulva normal.     Right Labia: No rash, tenderness or lesions.    Left Labia: No tenderness, lesions or rash.    No vaginal discharge, erythema or tenderness.      Right Adnexa: not tender and no mass present.    Left Adnexa: not tender and no mass present.    No cervical friability or polyp.     Uterus is not enlarged or tender.  Breasts:    Right: No mass, nipple discharge, skin change or tenderness.     Left: No mass, nipple discharge, skin change or tenderness.  Neck:     Thyroid: No thyromegaly.  Cardiovascular:     Rate and  Rhythm: Normal rate and regular rhythm.     Heart sounds: Normal heart sounds. No murmur heard. Pulmonary:     Effort: Pulmonary effort is normal.     Breath sounds: Normal breath sounds.  Abdominal:     Palpations: Abdomen is soft.     Tenderness: There is no abdominal tenderness. There is no guarding or rebound.  Musculoskeletal:        General: Normal range of motion.     Cervical back: Normal range of motion.  Lymphadenopathy:     Cervical: No cervical adenopathy.  Neurological:     General: No focal deficit present.     Mental Status: She is alert and oriented to person, place, and time.     Cranial Nerves: No cranial nerve deficit.  Skin:    General: Skin is warm and dry.  Psychiatric:        Mood and Affect: Mood normal.        Behavior: Behavior normal.        Thought Content: Thought content normal.        Judgment: Judgment normal.  Vitals reviewed.     Assessment/Plan:  Encounter for annual routine gynecological examination  Encounter for surveillance of contraceptive pills - Plan: norethindrone (MICRONOR) 0.35 MG tablet; Rx RF. Will re-eval need for Rx next yr.   Encounter for screening mammogram for malignant neoplasm of breast - Plan: MM 3D SCREEN BREAST BILATERAL; pt to schedule mammo  Anxiety - Plan: sertraline (ZOLOFT) 50 MG tablet; Rx RF. Doing well.    No orders of the defined types were placed in this encounter.           GYN counsel breast self exam, mammography screening, menopause, adequate intake of calcium and vitamin D, diet and exercise    F/U  No follow-ups on file.  Dimitriy Carreras B. Maceo Hernan, PA-C 07/01/2022 4:56 PM

## 2022-07-03 ENCOUNTER — Ambulatory Visit (INDEPENDENT_AMBULATORY_CARE_PROVIDER_SITE_OTHER): Payer: No Typology Code available for payment source | Admitting: Obstetrics and Gynecology

## 2022-07-03 ENCOUNTER — Other Ambulatory Visit (HOSPITAL_COMMUNITY)
Admission: RE | Admit: 2022-07-03 | Discharge: 2022-07-03 | Disposition: A | Discharge status: Home / Self Care | Payer: No Typology Code available for payment source | Source: Ambulatory Visit | Attending: Obstetrics and Gynecology | Admitting: Obstetrics and Gynecology

## 2022-07-03 ENCOUNTER — Encounter: Payer: Self-pay | Admitting: Obstetrics and Gynecology

## 2022-07-03 VITALS — BP 150/90 | Ht 67.0 in | Wt 272.0 lb

## 2022-07-03 DIAGNOSIS — Z1231 Encounter for screening mammogram for malignant neoplasm of breast: Secondary | ICD-10-CM

## 2022-07-03 DIAGNOSIS — Z01419 Encounter for gynecological examination (general) (routine) without abnormal findings: Secondary | ICD-10-CM | POA: Diagnosis not present

## 2022-07-03 DIAGNOSIS — Z124 Encounter for screening for malignant neoplasm of cervix: Secondary | ICD-10-CM

## 2022-07-03 DIAGNOSIS — Z1151 Encounter for screening for human papillomavirus (HPV): Secondary | ICD-10-CM | POA: Diagnosis present

## 2022-07-03 DIAGNOSIS — F419 Anxiety disorder, unspecified: Secondary | ICD-10-CM

## 2022-07-03 DIAGNOSIS — Z3041 Encounter for surveillance of contraceptive pills: Secondary | ICD-10-CM

## 2022-07-03 MED ORDER — SERTRALINE HCL 50 MG PO TABS: 50.0000 mg | ORAL_TABLET | Freq: Every day | ORAL | 3 refills | Status: DC

## 2022-07-03 NOTE — Patient Instructions (Signed)
I value your feedback and you entrusting us with your care. If you get a [facility] patient survey, I would appreciate you taking the time to let us know about your experience today. Thank you!  [facility]: [phone]      

## 2022-07-04 LAB — CYTOLOGY - PAP
Adequacy: ABSENT
Comment: NEGATIVE
Diagnosis: NEGATIVE
High risk HPV: NEGATIVE

## 2022-08-20 ENCOUNTER — Ambulatory Visit: Payer: Self-pay | Admitting: Surgery

## 2022-08-20 DIAGNOSIS — R2232 Localized swelling, mass and lump, left upper limb: Secondary | ICD-10-CM | POA: Diagnosis not present

## 2022-08-20 NOTE — H&P (Signed)
Leslie Ross [id number]   Referring Provider:  Bufford Buttner, MD   Subjective   Chief Complaint: New Consultation ( Excision of lipoma on left shoulder, )     History of Present Illness:    57 year old woman history of hypertension, sleep apnea, obesity Presents for evaluation of a subcutaneous mass on her left shoulder.  This is been present for several years (10) and gradually increased in size. No prior procedures or trauma in this area.  He has also noted a second lesion a bit further down the shoulder.  These are not painful but they do bother her.   Review of Systems: A complete review of systems was obtained from the patient.  I have reviewed this information and discussed as appropriate with the patient.  See HPI as well for other ROS.   Medical History: History reviewed. No pertinent past medical history.  There is no problem list on file for this patient.   Past Surgical History:  Procedure Laterality Date   CESAREAN SECTION     TONSILLECTOMY       No Known Allergies  Current Outpatient Medications on File Prior to Visit  Medication Sig Dispense Refill   hydroCHLOROthiazide (HYDRODIURIL) 12.5 MG tablet      sertraline (ZOLOFT) 50 MG tablet      b complex vitamins capsule Take 1 capsule by mouth once daily     cetirizine (ZYRTEC) 10 MG tablet Take 10 mg by mouth at bedtime     magnesium oxide (MAG-OX) 400 mg (241.3 mg magnesium) tablet Take by mouth     norethindrone (MICRONOR) 0.35 mg tablet      omega-3/dha/epa/fish oil (ULTRA OMEGA-3 ORAL) Take by mouth     No current facility-administered medications on file prior to visit.    Family History  Problem Relation Age of Onset   Coronary Artery Disease (Blocked arteries around heart) Father      Social History   Tobacco Use  Smoking Status Former   Current packs/day: 0.00   Types: Cigarettes   Quit date: 03/31/2008   Years since quitting: 14.3  Smokeless Tobacco Never     Social  History   Socioeconomic History   Marital status: Married  Tobacco Use   Smoking status: Former    Current packs/day: 0.00    Types: Cigarettes    Quit date: 03/31/2008    Years since quitting: 14.3   Smokeless tobacco: Never  Substance and Sexual Activity   Alcohol use: Yes    Comment: social   Drug use: Never   Social Determinants of Health   Physical Activity: Sufficiently Active (04/17/2017)   Received from [facility]   Exercise Vital Sign    Days of Exercise per Week: 5 days    Minutes of Exercise per Session: 60 min  Stress: Stress Concern Present (04/17/2017)   Received from [facility] - Occupational Stress Questionnaire    Feeling of Stress : To some extent  Social Connections: Somewhat Isolated (04/17/2017)   Received from [facility]   Social Connection and Isolation Panel [NHANES]    Frequency of Communication with Friends and Family: Three times a week    Frequency of Social Gatherings with Friends and Family: Never    Attends Religious Services: Never    Database administrator or Organizations: No    Attends Banker Meetings: Never    Marital Status: Married    Objective:  Vitals:   08/20/22 1013  BP: 135/86  Pulse: 78  Temp: 36.7 C (98 F)  SpO2: 96%  Weight: (!) 123.4 kg (272 lb)  Height: 170.2 cm (5\' 7" )    Body mass index is 42.6 kg/m.  Gen: A&Ox3, no distress  Unlabored respirations On the left posterior shoulder is a smooth, mobile, soft subcutaneous mass approximately 6 x 7 cm in addition to which on the proximal posterior lateral left upper arm there is an approximately 3 x 3 cm slightly deeper but still soft and mobile subcutaneous mass  Assessment and Plan:  Diagnoses and all orders for this visit:  Subcutaneous mass    We discussed the technique of excision and relevant anatomy, went over risks of bleeding, infection, pain, scarring, injury to underlying structures including  muscle tissue, nerves etc., risk of chronic pain or recurrent lesion, seroma, hematoma, wound healing problems or undesired cosmetic result.  Discussed very rare incidence of malignancy.  Questions welcomed and answered.  Patient wishes to proceed.  Rayaan Garguilo Carlye Grippe, MD

## 2022-08-20 NOTE — H&P (View-Only) (Signed)
   Leslie Ross [id number]   Referring Provider:  Stinehelfer, Susan, MD   Subjective   Chief Complaint: New Consultation ( Excision of lipoma on left shoulder, )     History of Present Illness:    57-year-old woman history of hypertension, sleep apnea, obesity Presents for evaluation of a subcutaneous mass on her left shoulder.  This is been present for several years (10) and gradually increased in size. No prior procedures or trauma in this area.  He has also noted a second lesion a bit further down the shoulder.  These are not painful but they do bother her.   Review of Systems: A complete review of systems was obtained from the patient.  I have reviewed this information and discussed as appropriate with the patient.  See HPI as well for other ROS.   Medical History: History reviewed. No pertinent past medical history.  There is no problem list on file for this patient.   Past Surgical History:  Procedure Laterality Date   CESAREAN SECTION     TONSILLECTOMY       No Known Allergies  Current Outpatient Medications on File Prior to Visit  Medication Sig Dispense Refill   hydroCHLOROthiazide (HYDRODIURIL) 12.5 MG tablet      sertraline (ZOLOFT) 50 MG tablet      b complex vitamins capsule Take 1 capsule by mouth once daily     cetirizine (ZYRTEC) 10 MG tablet Take 10 mg by mouth at bedtime     magnesium oxide (MAG-OX) 400 mg (241.3 mg magnesium) tablet Take by mouth     norethindrone (MICRONOR) 0.35 mg tablet      omega-3/dha/epa/fish oil (ULTRA OMEGA-3 ORAL) Take by mouth     No current facility-administered medications on file prior to visit.    Family History  Problem Relation Age of Onset   Coronary Artery Disease (Blocked arteries around heart) Father      Social History   Tobacco Use  Smoking Status Former   Current packs/day: 0.00   Types: Cigarettes   Quit date: 03/31/2008   Years since quitting: 14.3  Smokeless Tobacco Never     Social  History   Socioeconomic History   Marital status: Married  Tobacco Use   Smoking status: Former    Current packs/day: 0.00    Types: Cigarettes    Quit date: 03/31/2008    Years since quitting: 14.3   Smokeless tobacco: Never  Substance and Sexual Activity   Alcohol use: Yes    Comment: social   Drug use: Never   Social Determinants of Health   Physical Activity: Sufficiently Active (04/17/2017)   Received from [place]   Exercise Vital Sign    Days of Exercise per Week: 5 days    Minutes of Exercise per Session: 60 min  Stress: Stress Concern Present (04/17/2017)   Received from [place]   [employer] [facility] [employer] - Occupational Stress Questionnaire    Feeling of Stress : To some extent  Social Connections: Somewhat Isolated (04/17/2017)   Received from [place]   Social Connection and Isolation Panel [NHANES]    Frequency of Communication with Friends and Family: Three times a week    Frequency of Social Gatherings with Friends and Family: Never    Attends Religious Services: Never    Active Member of Clubs or Organizations: No    Attends Club or Organization Meetings: Never    Marital Status: Married    Objective:      Vitals:   08/20/22 1013  BP: 135/86  Pulse: 78  Temp: 36.7 C (98 F)  SpO2: 96%  Weight: (!) 123.4 kg (272 lb)  Height: 170.2 cm (5' 7")    Body mass index is 42.6 kg/m.  Gen: A&Ox3, no distress  Unlabored respirations On the left posterior shoulder is a smooth, mobile, soft subcutaneous mass approximately 6 x 7 cm in addition to which on the proximal posterior lateral left upper arm there is an approximately 3 x 3 cm slightly deeper but still soft and mobile subcutaneous mass  Assessment and Plan:  Diagnoses and all orders for this visit:  Subcutaneous mass    We discussed the technique of excision and relevant anatomy, went over risks of bleeding, infection, pain, scarring, injury to underlying structures including  muscle tissue, nerves etc., risk of chronic pain or recurrent lesion, seroma, hematoma, wound healing problems or undesired cosmetic result.  Discussed very rare incidence of malignancy.  Questions welcomed and answered.  Patient wishes to proceed.  Leslie Ross Leslie Reno Clasby, MD      

## 2022-08-26 ENCOUNTER — Encounter (HOSPITAL_BASED_OUTPATIENT_CLINIC_OR_DEPARTMENT_OTHER): Payer: Self-pay | Admitting: Surgery

## 2022-08-26 NOTE — Progress Notes (Signed)
Spoke w/ via phone for pre-op interview--- pt Lab needs dos----   [employer], ekg            Lab results------ no COVID test -----patient states asymptomatic no test needed Arrive at ------- 0900 on 09-08-2022 NPO after MN NO Solid Food.  Clear liquids from MN until--- 0800 Med rec completed Medications to take morning of surgery ----- none Diabetic medication ----- n/a Patient instructed no nail polish to be worn day of surgery Patient instructed to bring photo id and insurance card day of surgery Patient aware to have Driver (ride ) / caregiver    for 24 hours after surgery -- husband, gary Patient Special Instructions ----- pt verbalized understanding to obtain hibiclens from any pharmacy and shower with this soap night before and morning of surgery chin to toe , after am shower no lotion/ powder/ perfume on skin  Pre-Op special Instructions ----- n/a Patient verbalized understanding of instructions that were given at this phone interview. Patient denies shortness of breath, chest pain, fever, cough at this phone interview.

## 2022-08-30 HISTORY — PX: OTHER SURGICAL HISTORY: SHX169

## 2022-09-04 ENCOUNTER — Ambulatory Visit
Admission: RE | Admit: 2022-09-04 | Discharge: 2022-09-04 | Disposition: A | Discharge status: Home / Self Care | Payer: 59 | Source: Ambulatory Visit | Attending: Obstetrics and Gynecology | Admitting: Obstetrics and Gynecology

## 2022-09-04 DIAGNOSIS — Z1231 Encounter for screening mammogram for malignant neoplasm of breast: Secondary | ICD-10-CM | POA: Diagnosis not present

## 2022-09-05 NOTE — Anesthesia Preprocedure Evaluation (Signed)
Anesthesia Evaluation  Patient identified by MRN, date of birth, ID band Patient awake    Reviewed: Allergy & Precautions, NPO status , Patient's Chart, lab work & pertinent test results  Airway Mallampati: II  TM Distance: >3 FB Neck ROM: Full    Dental no notable dental hx. (+) Teeth Intact, Dental Advisory Given   Pulmonary former smoker   Pulmonary exam normal breath sounds clear to auscultation       Cardiovascular hypertension, Normal cardiovascular exam Rhythm:Regular Rate:Normal     Neuro/Psych  PSYCHIATRIC DISORDERS Anxiety     negative neurological ROS     GI/Hepatic negative GI ROS, Neg liver ROS,,,  Endo/Other    Renal/GU Lab Results      Component                Value               Date                      CREATININE               0.70                09/08/2022                 K                        3.6                 09/08/2022                 Musculoskeletal   Abdominal  (+) + obese (BMI 42.99)  Peds  Hematology Lab Results      Component                Value               Date                      WBC                      6.2                 06/19/2022                HGB                      15.3 (H)            09/08/2022                HCT                      45.0                09/08/2022                MCV                      93.9                06/19/2022                PLT                      411 (H)  06/19/2022              Anesthesia Other Findings   Reproductive/Obstetrics                             Anesthesia Physical Anesthesia Plan  ASA: 3  Anesthesia Plan: MAC   Post-op Pain Management: Precedex, Tylenol PO (pre-op)* and Toradol IV (intra-op)*   Induction: Intravenous  PONV Risk Score and Plan: Propofol infusion, Treatment may vary due to age or medical condition and Ondansetron  Airway Management Planned: Nasal Cannula, Natural  Airway and Simple Face Mask  Additional Equipment: None  Intra-op Plan:   Post-operative Plan:   Informed Consent: I have reviewed the patients History and Physical, chart, labs and discussed the procedure including the risks, benefits and alternatives for the proposed anesthesia with the patient or authorized representative who has indicated his/her understanding and acceptance.     Dental advisory given  Plan Discussed with:   Anesthesia Plan Comments:         Anesthesia Quick Evaluation

## 2022-09-08 ENCOUNTER — Ambulatory Visit (HOSPITAL_BASED_OUTPATIENT_CLINIC_OR_DEPARTMENT_OTHER)
Admission: RE | Admit: 2022-09-08 | Discharge: 2022-09-08 | Disposition: A | Discharge status: Home / Self Care | Payer: 59 | Attending: Surgery | Admitting: Surgery

## 2022-09-08 ENCOUNTER — Other Ambulatory Visit: Payer: Self-pay

## 2022-09-08 ENCOUNTER — Ambulatory Visit (HOSPITAL_BASED_OUTPATIENT_CLINIC_OR_DEPARTMENT_OTHER): Payer: 59 | Admitting: Anesthesiology

## 2022-09-08 ENCOUNTER — Encounter (HOSPITAL_BASED_OUTPATIENT_CLINIC_OR_DEPARTMENT_OTHER)
Admission: RE | Disposition: A | Discharge status: Home / Self Care | Payer: Self-pay | Source: Home / Self Care | Attending: Surgery

## 2022-09-08 ENCOUNTER — Encounter (HOSPITAL_BASED_OUTPATIENT_CLINIC_OR_DEPARTMENT_OTHER): Payer: Self-pay | Admitting: Surgery

## 2022-09-08 DIAGNOSIS — I10 Essential (primary) hypertension: Secondary | ICD-10-CM | POA: Diagnosis not present

## 2022-09-08 DIAGNOSIS — L720 Epidermal cyst: Secondary | ICD-10-CM | POA: Insufficient documentation

## 2022-09-08 DIAGNOSIS — Z87891 Personal history of nicotine dependence: Secondary | ICD-10-CM

## 2022-09-08 DIAGNOSIS — Z6841 Body Mass Index (BMI) 40.0 and over, adult: Secondary | ICD-10-CM | POA: Insufficient documentation

## 2022-09-08 DIAGNOSIS — Z09 Encounter for follow-up examination after completed treatment for conditions other than malignant neoplasm: Secondary | ICD-10-CM | POA: Diagnosis not present

## 2022-09-08 DIAGNOSIS — Z01818 Encounter for other preprocedural examination: Secondary | ICD-10-CM

## 2022-09-08 DIAGNOSIS — E669 Obesity, unspecified: Secondary | ICD-10-CM | POA: Diagnosis not present

## 2022-09-08 DIAGNOSIS — D1722 Benign lipomatous neoplasm of skin and subcutaneous tissue of left arm: Secondary | ICD-10-CM | POA: Diagnosis not present

## 2022-09-08 DIAGNOSIS — L729 Follicular cyst of the skin and subcutaneous tissue, unspecified: Secondary | ICD-10-CM | POA: Diagnosis not present

## 2022-09-08 DIAGNOSIS — D171 Benign lipomatous neoplasm of skin and subcutaneous tissue of trunk: Secondary | ICD-10-CM | POA: Insufficient documentation

## 2022-09-08 DIAGNOSIS — L723 Sebaceous cyst: Secondary | ICD-10-CM | POA: Insufficient documentation

## 2022-09-08 DIAGNOSIS — G4730 Sleep apnea, unspecified: Secondary | ICD-10-CM | POA: Insufficient documentation

## 2022-09-08 HISTORY — DX: Presence of spectacles and contact lenses: Z97.3

## 2022-09-08 HISTORY — DX: Obstructive sleep apnea (adult) (pediatric): G47.33

## 2022-09-08 HISTORY — DX: Other seasonal allergic rhinitis: J30.2

## 2022-09-08 HISTORY — DX: Generalized anxiety disorder: F41.1

## 2022-09-08 HISTORY — DX: Benign lipomatous neoplasm of skin and subcutaneous tissue of left arm: D17.22

## 2022-09-08 HISTORY — DX: Prediabetes: R73.03

## 2022-09-08 LAB — POCT I-STAT, CHEM 8
BUN: 15 mg/dL (ref 6–20)
Calcium, Ion: 1.15 mmol/L (ref 1.15–1.40)
Chloride: 105 mmol/L (ref 98–111)
Creatinine, Ser: 0.7 mg/dL (ref 0.44–1.00)
Glucose, Bld: 115 mg/dL — ABNORMAL HIGH (ref 70–99)
HCT: 45 % (ref 36.0–46.0)
Hemoglobin: 15.3 g/dL — ABNORMAL HIGH (ref 12.0–15.0)
Potassium: 3.6 mmol/L (ref 3.5–5.1)
Sodium: 142 mmol/L (ref 135–145)
TCO2: 25 mmol/L (ref 22–32)

## 2022-09-08 SURGERY — EXCISION, MASS, UPPER EXTREMITY
Anesthesia: Monitor Anesthesia Care | Site: Shoulder | Laterality: Left

## 2022-09-08 MED ORDER — LACTATED RINGERS IV SOLN
INTRAVENOUS | Status: DC
  Administered 2022-09-08: 1000 mL via INTRAVENOUS

## 2022-09-08 MED ORDER — FENTANYL CITRATE (PF) 250 MCG/5ML IJ SOLN
INTRAMUSCULAR | Status: DC | PRN
  Administered 2022-09-08: 25 ug via INTRAVENOUS
  Administered 2022-09-08: 50 ug via INTRAVENOUS
  Administered 2022-09-08: 25 ug via INTRAVENOUS

## 2022-09-08 MED ORDER — FENTANYL CITRATE (PF) 100 MCG/2ML IJ SOLN
INTRAMUSCULAR | Status: AC
  Filled 2022-09-08: qty 2

## 2022-09-08 MED ORDER — GABAPENTIN 300 MG PO CAPS
ORAL_CAPSULE | ORAL | Status: AC
  Filled 2022-09-08: qty 1

## 2022-09-08 MED ORDER — PHENYLEPHRINE 80 MCG/ML (10ML) SYRINGE FOR IV PUSH (FOR BLOOD PRESSURE SUPPORT)
PREFILLED_SYRINGE | INTRAVENOUS | Status: DC | PRN
  Administered 2022-09-08 (×2): 80 ug via INTRAVENOUS

## 2022-09-08 MED ORDER — DEXMEDETOMIDINE HCL IN NACL 80 MCG/20ML IV SOLN
INTRAVENOUS | Status: DC | PRN
  Administered 2022-09-08: 8 ug via INTRAVENOUS

## 2022-09-08 MED ORDER — GABAPENTIN 300 MG PO CAPS
300.0000 mg | ORAL_CAPSULE | ORAL | Status: AC
  Administered 2022-09-08: 300 mg via ORAL

## 2022-09-08 MED ORDER — ONDANSETRON HCL 4 MG/2ML IJ SOLN
INTRAMUSCULAR | Status: AC
  Filled 2022-09-08: qty 4

## 2022-09-08 MED ORDER — 0.9 % SODIUM CHLORIDE (POUR BTL) OPTIME
TOPICAL | Status: DC | PRN
  Administered 2022-09-08: 500 mL

## 2022-09-08 MED ORDER — PROPOFOL 1000 MG/100ML IV EMUL
INTRAVENOUS | Status: AC
  Filled 2022-09-08: qty 100

## 2022-09-08 MED ORDER — LIDOCAINE 2% (20 MG/ML) 5 ML SYRINGE
INTRAMUSCULAR | Status: DC | PRN
  Administered 2022-09-08: 50 mg via INTRAVENOUS

## 2022-09-08 MED ORDER — ACETAMINOPHEN 500 MG PO TABS
1000.0000 mg | ORAL_TABLET | ORAL | Status: AC
  Administered 2022-09-08: 1000 mg via ORAL

## 2022-09-08 MED ORDER — LIDOCAINE HCL (PF) 2 % IJ SOLN
INTRAMUSCULAR | Status: AC
  Filled 2022-09-08: qty 5

## 2022-09-08 MED ORDER — PROPOFOL 10 MG/ML IV BOLUS
INTRAVENOUS | Status: DC | PRN
  Administered 2022-09-08: 20 mg via INTRAVENOUS

## 2022-09-08 MED ORDER — CEFAZOLIN SODIUM-DEXTROSE 2-4 GM/100ML-% IV SOLN
2.0000 g | INTRAVENOUS | Status: AC
  Administered 2022-09-08: 2 g via INTRAVENOUS

## 2022-09-08 MED ORDER — MIDAZOLAM HCL 2 MG/2ML IJ SOLN
INTRAMUSCULAR | Status: DC | PRN
  Administered 2022-09-08: 2 mg via INTRAVENOUS

## 2022-09-08 MED ORDER — ONDANSETRON HCL 4 MG/2ML IJ SOLN
INTRAMUSCULAR | Status: DC | PRN
  Administered 2022-09-08: 4 mg via INTRAVENOUS

## 2022-09-08 MED ORDER — TRAMADOL HCL 50 MG PO TABS: 50.0000 mg | ORAL_TABLET | Freq: Four times a day (QID) | ORAL | 0 refills | Status: AC | PRN

## 2022-09-08 MED ORDER — BUPIVACAINE-EPINEPHRINE 0.25% -1:200000 IJ SOLN
INTRAMUSCULAR | Status: DC | PRN
  Administered 2022-09-08: 26 mL

## 2022-09-08 MED ORDER — CEFAZOLIN SODIUM-DEXTROSE 2-4 GM/100ML-% IV SOLN
INTRAVENOUS | Status: AC
  Filled 2022-09-08: qty 100

## 2022-09-08 MED ORDER — ACETAMINOPHEN 500 MG PO TABS
ORAL_TABLET | ORAL | Status: AC
  Filled 2022-09-08: qty 2

## 2022-09-08 MED ORDER — PROPOFOL 500 MG/50ML IV EMUL
INTRAVENOUS | Status: DC | PRN
  Administered 2022-09-08: 180 ug/kg/min via INTRAVENOUS

## 2022-09-08 MED ORDER — MIDAZOLAM HCL 2 MG/2ML IJ SOLN
INTRAMUSCULAR | Status: AC
  Filled 2022-09-08: qty 2

## 2022-09-08 MED ORDER — PROPOFOL 10 MG/ML IV BOLUS
INTRAVENOUS | Status: AC
  Filled 2022-09-08: qty 20

## 2022-09-08 SURGICAL SUPPLY — 45 items
ADH SKN CLS APL DERMABOND .7 (GAUZE/BANDAGES/DRESSINGS) ×1
APL PRP STRL LF DISP 70% ISPRP (MISCELLANEOUS) ×1
BLADE SURG 15 STRL LF DISP TIS (BLADE) ×1 IMPLANT
BLADE SURG 15 STRL SS (BLADE) ×1
BRIEF MESH DISP LRG (UNDERPADS AND DIAPERS) IMPLANT
CHLORAPREP W/TINT 26 (MISCELLANEOUS) IMPLANT
COVER BACK TABLE 60X90IN (DRAPES) ×1 IMPLANT
COVER MAYO STAND STRL (DRAPES) ×1 IMPLANT
DERMABOND ADVANCED .7 DNX12 (GAUZE/BANDAGES/DRESSINGS) IMPLANT
DRAPE LAPAROTOMY 100X72 PEDS (DRAPES) IMPLANT
DRAPE LAPAROTOMY TRNSV 102X78 (DRAPES) IMPLANT
DRAPE UTILITY XL STRL (DRAPES) ×1 IMPLANT
ELECT REM PT RETURN 9FT ADLT (ELECTROSURGICAL) ×1
ELECTRODE REM PT RTRN 9FT ADLT (ELECTROSURGICAL) ×1 IMPLANT
GAUZE 4X4 16PLY ~~LOC~~+RFID DBL (SPONGE) ×1 IMPLANT
GAUZE PAD ABD 8X10 STRL (GAUZE/BANDAGES/DRESSINGS) IMPLANT
GAUZE SPONGE 4X4 12PLY STRL (GAUZE/BANDAGES/DRESSINGS) ×1 IMPLANT
GLOVE BIO SURGEON STRL SZ 6 (GLOVE) ×1 IMPLANT
GLOVE INDICATOR 6.5 STRL GRN (GLOVE) ×1 IMPLANT
GOWN STRL REUS W/TWL LRG LVL3 (GOWN DISPOSABLE) ×1 IMPLANT
KIT SIGMOIDOSCOPE (SET/KITS/TRAYS/PACK) IMPLANT
KIT TURNOVER CYSTO (KITS) ×1 IMPLANT
NDL HYPO 22X1.5 SAFETY MO (MISCELLANEOUS) IMPLANT
NDL HYPO 25X1 1.5 SAFETY (NEEDLE) IMPLANT
NEEDLE HYPO 22X1.5 SAFETY MO (MISCELLANEOUS) IMPLANT
NEEDLE HYPO 25X1 1.5 SAFETY (NEEDLE) ×1 IMPLANT
PACK BASIN DAY SURGERY FS (CUSTOM PROCEDURE TRAY) ×1 IMPLANT
PENCIL SMOKE EVACUATOR (MISCELLANEOUS) ×1 IMPLANT
SLEEVE SCD COMPRESS KNEE MED (STOCKING) ×1 IMPLANT
SPONGE T-LAP 18X18 ~~LOC~~+RFID (SPONGE) IMPLANT
SPONGE T-LAP 4X18 ~~LOC~~+RFID (SPONGE) IMPLANT
STAPLER VISISTAT 35W (STAPLE) IMPLANT
SUCTION TUBE FRAZIER 10FR DISP (SUCTIONS) IMPLANT
SUT CHROMIC 3 0 SH 27 (SUTURE) IMPLANT
SUT MNCRL AB 4-0 PS2 18 (SUTURE) ×1 IMPLANT
SUT VIC AB 2-0 SH 27 (SUTURE)
SUT VIC AB 2-0 SH 27XBRD (SUTURE) IMPLANT
SUT VIC AB 3-0 SH 27 (SUTURE) ×1
SUT VIC AB 3-0 SH 27X BRD (SUTURE) ×1 IMPLANT
SYR BULB IRRIG 60ML STRL (SYRINGE) ×1 IMPLANT
SYR CONTROL 10ML LL (SYRINGE) ×1 IMPLANT
TOWEL OR 17X24 6PK STRL BLUE (TOWEL DISPOSABLE) ×1 IMPLANT
TRAY DSU PREP LF (CUSTOM PROCEDURE TRAY) IMPLANT
TUBE CONNECTING 12X1/4 (SUCTIONS) ×1 IMPLANT
YANKAUER SUCT BULB TIP NO VENT (SUCTIONS) ×1 IMPLANT

## 2022-09-08 NOTE — Interval H&P Note (Signed)
No change. Proceed to OR.

## 2022-09-08 NOTE — Op Note (Signed)
Operative Note  HAYLEI COBIN  [id number]  [id number]  09/08/2022   Surgeon: Phylliss Blakes MD FACS   Procedure performed: 1.  Excision of subcutaneous cyst 5 x 5 x 5 cm, left shoulder. 2.  Excision of subcutaneous lipoma, 3 x 2 x 2 cm, left shoulder   Preop diagnosis: Lipoma of left deltoid region and lipoma of left upper back/posterior shoulder region Post-op diagnosis/intraop findings: Lipoma of the left deltoid region with above dimensions, left upper back/posterior shoulder lesion was a large sebaceous cyst   Specimens: Left shoulder cyst, left shoulder lipoma Retained items: no  EBL: Minimal cc Complications: none   Description of procedure: After obtaining informed consent the patient was taken to the operating room and placed in the right lateral decubitus position on the operating room table where MAC was initiated, preoperative antibiotics were administered, SCDs applied, and a formal timeout was performed.  The left shoulder was prepped and draped in usual sterile fashion.  Field blocks were performed using quarter percent Marcaine with epinephrine around both of the lesions which had been previously marked.  Beginning with the lesion on the left upper back an incision was made along the Langer's lines and the surface of the lesion encountered.  The cyst was entered extruding gray cottage cheeselike material consistent with sebaceous cyst.  Continued cautery and blunt dissection ensued to completely excise the cyst wall from the surrounding soft tissues.  This and its contents were handed off for pathology.  Hemostasis was ensured within the wound.  We then turned the lesion on the left deltoid region.  In a similar fashion an incision was made along Langer's lines and dissection carried through with the cautery until the surface of a well encapsulated lobular lipoma was encountered.  Blunt dissection was then used to free the lipoma from attachments to the surrounding soft tissues.   Hemostasis was ensured within the wound.  At this point both incisions were irrigated and hemostasis was excellent.  The incisions were both closed with deep dermal 3-0 Vicryl's followed by running subcuticular 4-0 Monocryl and Dermabond.  The patient was then awakened and taken to PACU in stable condition.    All counts were correct at the completion of the case.

## 2022-09-08 NOTE — Anesthesia Postprocedure Evaluation (Signed)
Anesthesia Post Note  Patient: Leslie Ross  Procedure(s) Performed: EXCISION OF SUBCUTANEOUS MASS x2 LEFT SHOULDER (Left: Shoulder)     Patient location during evaluation: PACU Anesthesia Type: MAC Level of consciousness: awake and alert Pain management: pain level controlled Vital Signs Assessment: post-procedure vital signs reviewed and stable Respiratory status: spontaneous breathing, nonlabored ventilation, respiratory function stable and patient connected to nasal cannula oxygen Cardiovascular status: stable and blood pressure returned to baseline Postop Assessment: no apparent nausea or vomiting Anesthetic complications: no   No notable events documented.  Last Vitals:  Vitals:   09/08/22 1315 09/08/22 1343  BP: 132/87 127/76  Pulse: 64 (!) 57  Resp: 20 20  Temp:    SpO2: 94% 99%    Last Pain:  Vitals:   09/08/22 1343  TempSrc:   PainSc: 0-No pain                 Trevor Iha

## 2022-09-08 NOTE — Transfer of Care (Signed)
Immediate Anesthesia Transfer of Care Note  Patient: Leslie Ross  Procedure(s) Performed: EXCISION OF SUBCUTANEOUS MASS x2 LEFT SHOULDER (Left: Shoulder)  Patient Location: PACU  Anesthesia Type:MAC  Level of Consciousness: awake, alert , and oriented  Airway & Oxygen Therapy: Patient Spontanous Breathing and Patient connected to nasal cannula oxygen  Post-op Assessment: Report given to RN and Post -op Vital signs reviewed and stable  Post vital signs: Reviewed and stable  Last Vitals:  Vitals Value Taken Time  BP 111/70 09/08/22 1249  Temp    Pulse 63 09/08/22 1252  Resp 15 09/08/22 1252  SpO2 98 % 09/08/22 1252  Vitals shown include unvalidated device data.  Last Pain:  Vitals:   09/08/22 0925  TempSrc: Oral  PainSc: 0-No pain      Patients Stated Pain Goal: 5 (09/08/22 0925)  Complications: No notable events documented.

## 2022-09-08 NOTE — Discharge Instructions (Addendum)
GENERAL SURGERY: POST OP INSTRUCTIONS  EAT Gradually transition to a high fiber diet with a fiber supplement over the next few weeks after discharge.  Start with a pureed / full liquid diet (see below)  WALK Walk an hour a day (cumulative, not all at once).  Control your pain to do that.    CONTROL PAIN Control pain so that you can walk, sleep, tolerate sneezing/coughing, go up/down stairs.  HAVE A BOWEL MOVEMENT DAILY Keep your bowels regular to avoid problems.  OK to try a laxative to override constipation.  OK to use an antidiarrheal to slow down diarrhea.  Call if not better after 2 tries  CALL IF YOU HAVE PROBLEMS/CONCERNS Call if you are still struggling despite following these instructions. Call if you have concerns not answered by these instructions    DIET: Follow a light bland diet & liquids the first 24 hours after arrival home, such as soup, liquids, starches, etc.  Be sure to drink plenty of fluids.  Quickly advance to a usual solid diet within a few days.  Avoid fast food or heavy meals as you are more likely to get nauseated or have irregular bowels.  A low-sugar, high-fiber diet for the rest of your life is ideal.   Take your usually prescribed home medications unless otherwise directed. PAIN CONTROL: Pain is best controlled by a usual combination of three different methods TOGETHER: Ice/Heat Over the counter pain medication Prescription pain medication Most patients will experience some swelling and bruising around the incisions.  Ice packs or heating pads (30-60 minutes up to 6 times a day) will help. Use ice for the first few days to help decrease swelling and bruising, then switch to heat to help relax tight/sore spots and speed recovery.  Some people prefer to use ice alone, heat alone, alternating between ice & heat.  Experiment to what works for you.  Swelling and bruising can take several weeks to resolve.   It is helpful to take an over-the-counter pain medication  regularly for the first few weeks.  Choose one of the following that works best for you: Naproxen (Aleve, etc)  Two 220mg  tabs twice a day OR Ibuprofen (Advil, etc) Three 200mg  tabs four times a day (every meal & bedtime) AND Acetaminophen (Tylenol, etc) 500-650mg  four times a day (every meal & bedtime) A  prescription for pain medication (such as oxycodone, hydrocodone, etc) should be given to you upon discharge.  Take your pain medication as prescribed.  If you are having problems/concerns with the prescription medicine (does not control pain, nausea, vomiting, rash, itching, etc), please call us [phone] to see if we need to switch you to a different pain medicine that will work better for you and/or control your side effect better. If you need a refill on your pain medication, please contact your pharmacy.  They will contact our office to request authorization. Prescriptions will not be filled after 5 pm or on week-ends. Avoid getting constipated.  Between the surgery and the pain medications, it is common to experience some constipation.  Increasing fluid intake and taking a fiber supplement (such as Metamucil, Citrucel, FiberCon, MiraLax, etc) 1-2 times a day regularly will usually help prevent this problem from occurring.  A mild laxative (prune juice, Milk of Magnesia, MiraLax, etc) should be taken according to package directions if there are no bowel movements after 48 hours.   Wash / shower every day, starting 2 days after surgery.  You may shower over the  skin glue which is waterproof.  Continue to shower over incision(s) after the dressing is off. Glue will flake off after 1-2 weeks.  You may leave the incision open to air.  You may have skin tapes (Steri Strips) covering the incision(s).  Leave them on until one week, then remove.  You may replace a dressing/Band-Aid to cover the incision for comfort if you wish.      ACTIVITIES as tolerated:   You may resume regular (light) daily  activities beginning the next day--such as daily self-care, walking, climbing stairs--gradually increasing activities as tolerated.  If you can walk 30 minutes without difficulty, it is safe to try more intense activity such as jogging, treadmill, bicycling, low-impact aerobics, swimming, etc. Save the most intensive and strenuous activity for last such as sit-ups, heavy lifting, contact sports, etc  Refrain from any heavy lifting or straining until you are off narcotics for pain control.   DO NOT PUSH THROUGH PAIN.  Let pain be your guide: If it hurts to do something, don't do it.  Pain is your body warning you to avoid that activity for another week until the pain goes down. You may drive when you are no longer taking prescription pain medication, you can comfortably wear a seatbelt, and you can safely maneuver your car and apply brakes. You may have sexual intercourse when it is comfortable.  FOLLOW UP in our office Please call [facility] at [phone] to set up an appointment to see your surgeon in the office for a follow-up appointment approximately 2-3 weeks after your surgery. Make sure that you call for this appointment the day you arrive home to insure a convenient appointment time. 9. IF YOU HAVE DISABILITY OR FAMILY LEAVE FORMS, BRING THEM TO THE OFFICE FOR PROCESSING.  DO NOT GIVE THEM TO YOUR DOCTOR.   WHEN TO CALL us [phone]: Poor pain control Reactions / problems with new medications (rash/itching, nausea, etc)  Fever over 101.5 F (38.5 C) Worsening swelling or bruising Continued bleeding from incision. Increased pain, redness, or drainage from the incision Difficulty breathing / swallowing   The clinic staff is available to answer your questions during regular business hours (8:30am-5pm).  Please don't hesitate to call and ask to speak to one of our nurses for clinical concerns.   If you have a medical emergency, go to the nearest emergency room or call 911.  A surgeon from  [facility] is always on call at the [facility], [place] [address], [place], [place]  [address] ? MAIN: [phone] ? TOLL FREE: [phone] ?  FAX [phone] [email or web address]    Post Anesthesia Home Care Instructions  Activity: Get plenty of rest for the remainder of the day. A responsible individual must stay with you for 24 hours following the procedure.  For the next 24 hours, DO NOT: -Drive a car -Advertising copywriter -Drink alcoholic beverages -Take any medication unless instructed by your physician -Make any legal decisions or sign important papers.  Meals: Start with liquid foods such as gelatin or soup. Progress to regular foods as tolerated. Avoid greasy, spicy, heavy foods. If nausea and/or vomiting occur, drink only clear liquids until the nausea and/or vomiting subsides. Call your physician if vomiting continues.  Special Instructions/Symptoms: Your throat may feel dry or sore from the anesthesia or the breathing tube placed in your throat during surgery. If this causes discomfort, gargle with warm salt water. The discomfort should disappear  within 24 hours.  Make take Tylenol beginning at 4 PM as needed for discomfort/soreness.

## 2022-09-09 LAB — SURGICAL PATHOLOGY

## 2022-12-29 IMAGING — MG MM DIGITAL SCREENING BILAT W/ TOMO AND CAD
8 series · 8 of 24 positions shown · non-contrast
Comparison: Previous exam(s).

CLINICAL DATA: Screening.

EXAM:
DIGITAL SCREENING BILATERAL MAMMOGRAM WITH TOMOSYNTHESIS AND CAD
TECHNIQUE: Bilateral screening digital craniocaudal and mediolateral oblique
mammograms were obtained. Bilateral screening digital breast
tomosynthesis was performed. The images were evaluated with
computer-aided detection.

[L MLO synth-2D]
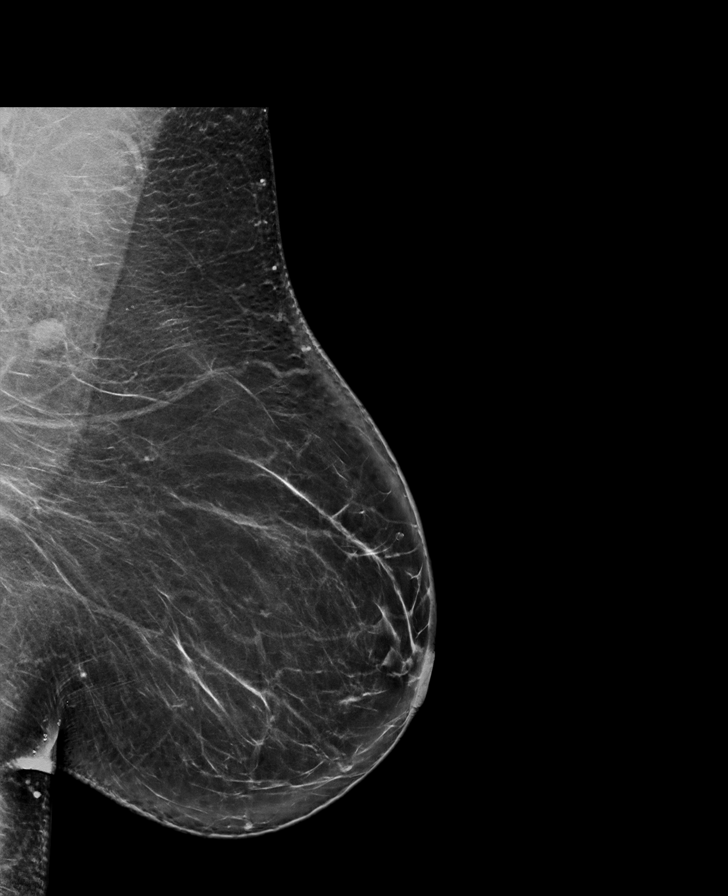

[L CC synth-2D]
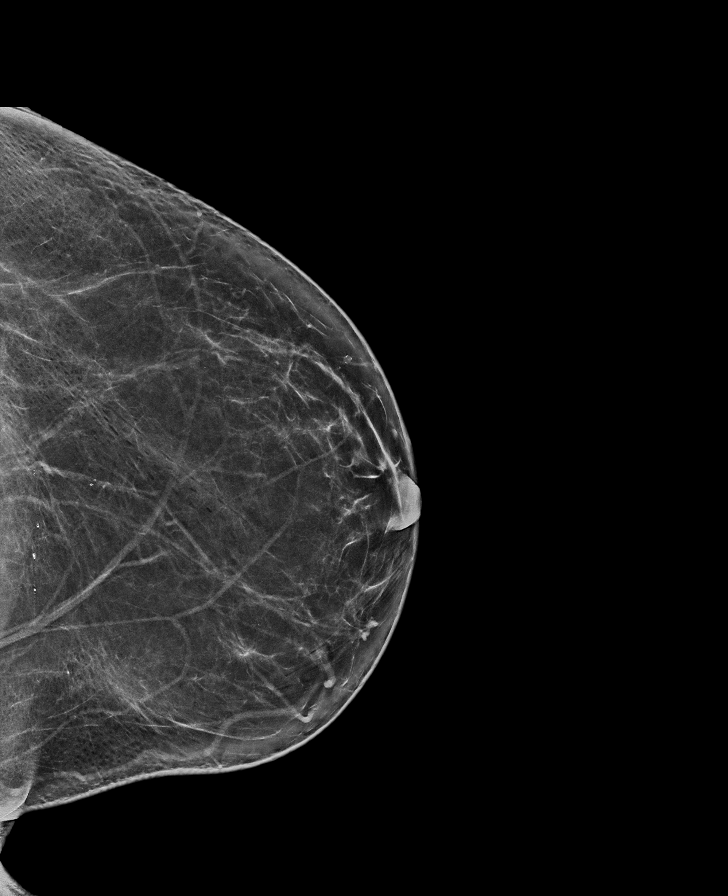

[R MLO synth-2D]
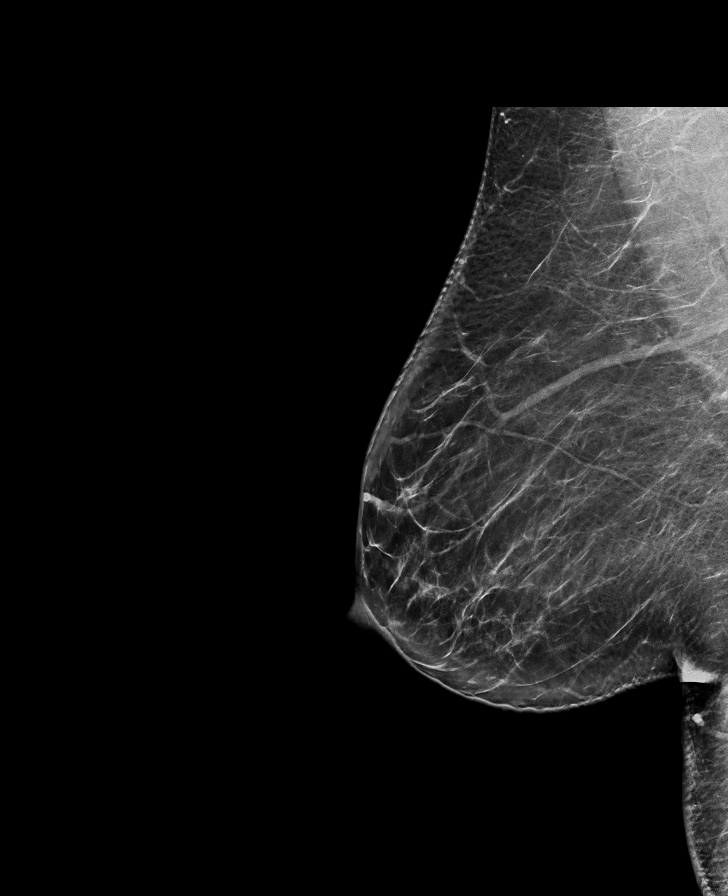

[R CC synth-2D]
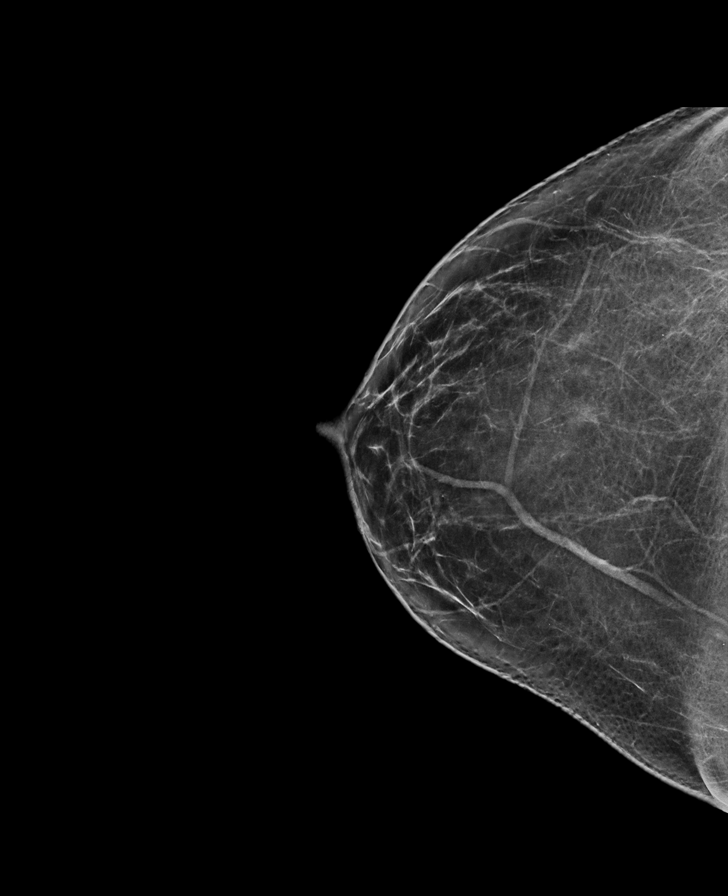

[L CC tomo · tomo slice 39/77.0]
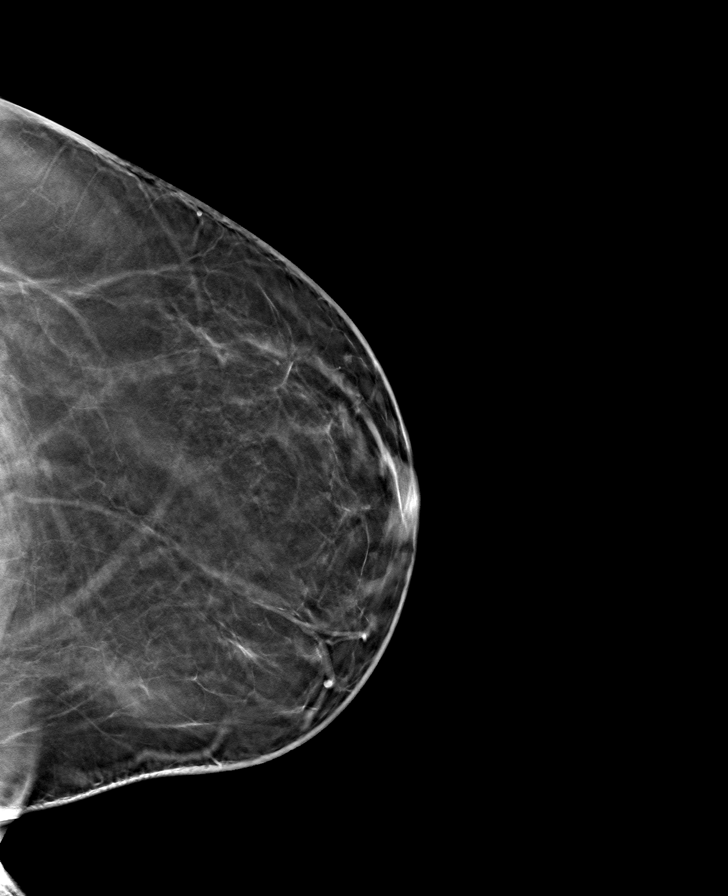

[R MLO tomo · tomo slice 40/79.0]
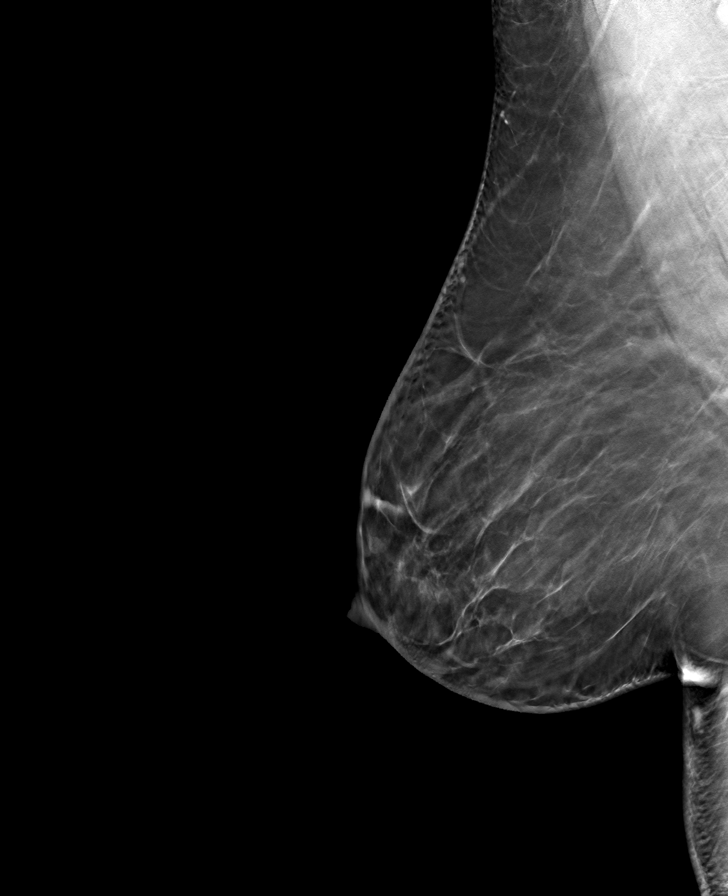

[R CC tomo · tomo slice 35/69.0]
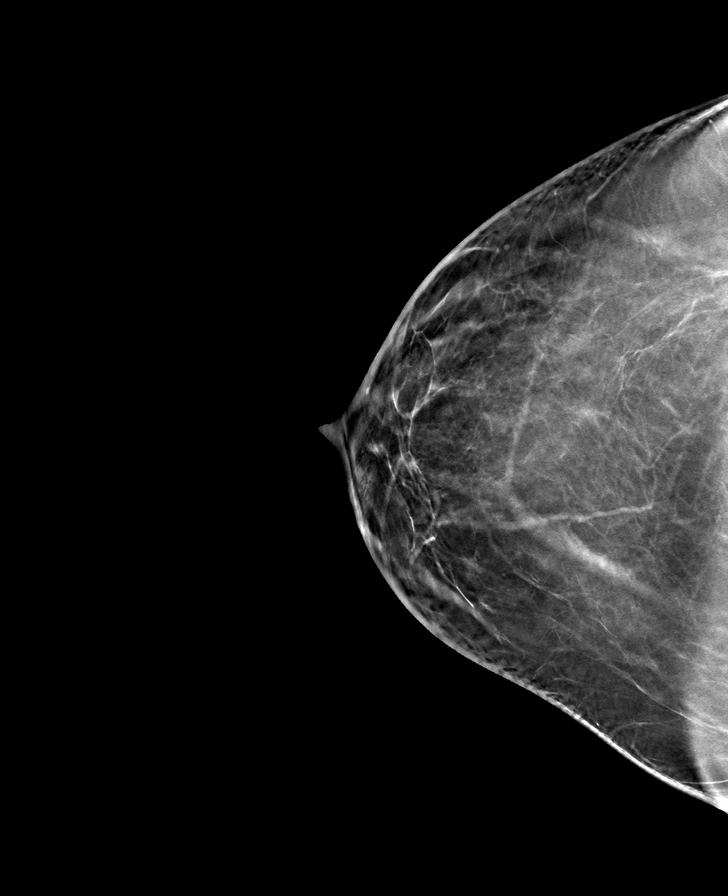

[L MLO tomo · tomo slice 45/88.0]
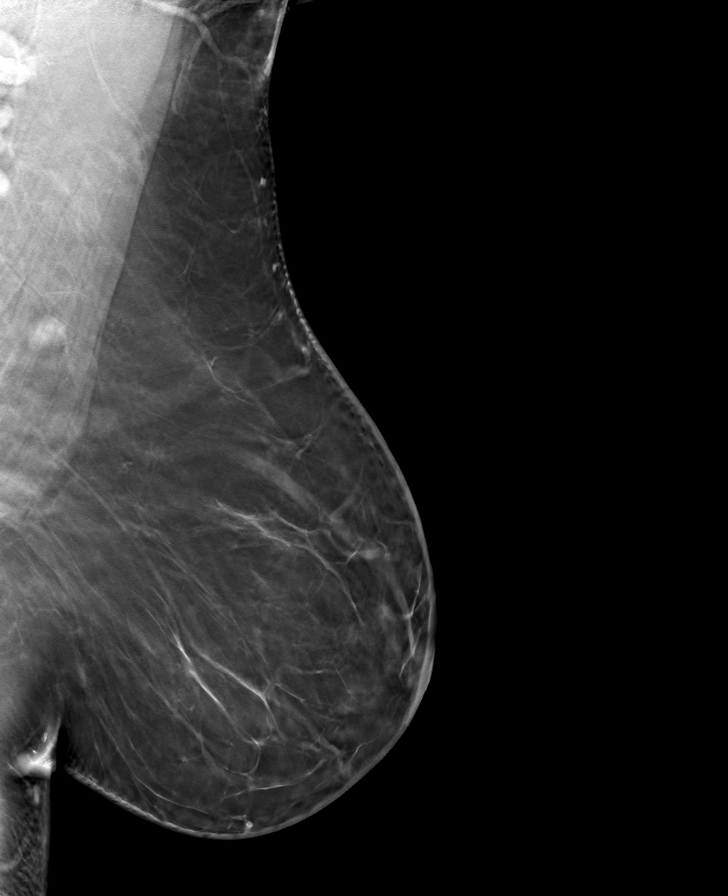

[8 of 24 positions shown; findings below may reference images not displayed]

ACR Breast Density Category b: There are scattered areas of
fibroglandular density.
FINDINGS: There are no findings suspicious for malignancy.
IMPRESSION: No mammographic evidence of malignancy. A result letter of this
screening mammogram will be mailed directly to the patient.

RECOMMENDATION:
Screening mammogram in one year. (Code:51-O-LD2)

BI-RADS CATEGORY  1: Negative.

## 2023-05-11 ENCOUNTER — Telehealth: Payer: No Typology Code available for payment source | Admitting: Physician Assistant

## 2023-05-11 DIAGNOSIS — J208 Acute bronchitis due to other specified organisms: Secondary | ICD-10-CM | POA: Diagnosis not present

## 2023-05-11 DIAGNOSIS — B9689 Other specified bacterial agents as the cause of diseases classified elsewhere: Secondary | ICD-10-CM | POA: Diagnosis not present

## 2023-05-11 MED ORDER — PROMETHAZINE-DM 6.25-15 MG/5ML PO SYRP
5.0000 mL | ORAL_SOLUTION | Freq: Four times a day (QID) | ORAL | 0 refills | Status: DC | PRN
Start: 2023-05-11 — End: 2023-06-15

## 2023-05-11 MED ORDER — ALBUTEROL SULFATE HFA 108 (90 BASE) MCG/ACT IN AERS
1.0000 | INHALATION_SPRAY | Freq: Four times a day (QID) | RESPIRATORY_TRACT | 0 refills | Status: AC | PRN
Start: 2023-05-11 — End: ?

## 2023-05-11 MED ORDER — AZITHROMYCIN 250 MG PO TABS
ORAL_TABLET | ORAL | 0 refills | Status: AC
Start: 2023-05-11 — End: 2023-05-16

## 2023-05-11 MED ORDER — BENZONATATE 100 MG PO CAPS
100.0000 mg | ORAL_CAPSULE | Freq: Three times a day (TID) | ORAL | 0 refills | Status: DC | PRN
Start: 2023-05-11 — End: 2023-06-15

## 2023-05-11 MED ORDER — PREDNISONE 20 MG PO TABS
40.0000 mg | ORAL_TABLET | Freq: Every day | ORAL | 0 refills | Status: DC
Start: 2023-05-11 — End: 2023-06-15

## 2023-05-11 NOTE — Progress Notes (Signed)
 Virtual Visit Consent   Leslie Ross, you are scheduled for a virtual visit with a [facility] provider today. Just as with appointments in the office, your consent must be obtained to participate. Your consent will be active for this visit and any virtual visit you may have with one of our providers in the next 365 days. If you have a MyChart account, a copy of this consent can be sent to you electronically.  As this is a virtual visit, video technology does not allow for your provider to perform a traditional examination. This may limit your provider's ability to fully assess your condition. If your provider identifies any concerns that need to be evaluated in person or the need to arrange testing (such as labs, EKG, etc.), we will make arrangements to do so. Although advances in technology are sophisticated, we cannot ensure that it will always work on either your end or our end. If the connection with a video visit is poor, the visit may have to be switched to a telephone visit. With either a video or telephone visit, we are not always able to ensure that we have a secure connection.  By engaging in this virtual visit, you consent to the provision of healthcare and authorize for your insurance to be billed (if applicable) for the services provided during this visit. Depending on your insurance coverage, you may receive a charge related to this service.  I need to obtain your verbal consent now. Are you willing to proceed with your visit today? Leslie Ross has provided verbal consent on 05/11/2023 for a virtual visit (video or telephone). Angelia Kelp, PA-C  Date: 05/11/2023 11:59 AM  Virtual Visit via Video Note   I, Angelia Kelp, connected with  Leslie Ross  ([id number], 08-Dec-1965) on 05/11/23 at 12:00 PM EST by a video-enabled telemedicine application and verified that I am speaking with the correct person using two identifiers.  Location: Patient: Virtual Visit  Location Patient: Home Provider: Virtual Visit Location Provider: Home Office   I discussed the limitations of evaluation and management by telemedicine and the availability of in person appointments. The patient expressed understanding and agreed to proceed.    History of Present Illness: Leslie Ross is a 58 y.o. who identifies as a female who was assigned female at birth, and is being seen today for cough and congestion.  HPI: Cough This is a new problem. The current episode started in the past 7 days (Started with head cold symptoms that have progressed to chest congestion). The problem has been gradually worsening. The problem occurs every few minutes. Cough characteristics: mixed; deep croupy cough per patient. Associated symptoms include chest pain (tightness), headaches (with coughing), myalgias, a sore throat (in the morning only), shortness of breath (just cant take in a full breath, deep breathing induces coughing) and wheezing. Pertinent negatives include no chills, ear congestion, ear pain, fever, nasal congestion, postnasal drip, rhinorrhea or sweats. The symptoms are aggravated by lying down. Treatments tried: mucinex dm, mucinex fast max, cough drops. Her past medical history is significant for asthma (never diagnosed but gets reactive aiway seasonally), bronchitis, environmental allergies and pneumonia (remote 2013 or 2014).  Negative at home Covid + Flu test    Problems:  Patient Active Problem List   Diagnosis Date Noted   Acute non-recurrent sinusitis 02/02/2020   Cough 02/02/2020   Abnormal uterine bleeding (AUB)    Thickened endometrium    Cervical polyp    Menometrorrhagia  05/23/2019   Essential hypertension 07/13/2018   Elevated hemoglobin A1c 07/13/2018   Special screening for malignant neoplasms, colon    Polyp of sigmoid colon    Anxiety 04/17/2017   Morbid obesity with BMI of 40.0-44.9, adult (HCC) 04/17/2017    Allergies: No Known Allergies Medications:   Current Outpatient Medications:    albuterol  (VENTOLIN  HFA) 108 (90 Base) MCG/ACT inhaler, Inhale 1-2 puffs into the lungs every 6 (six) hours as needed., Disp: 8 g, Rfl: 0   azithromycin  (ZITHROMAX ) 250 MG tablet, Take 2 tablets on day 1, then 1 tablet daily on days 2 through 5, Disp: 6 tablet, Rfl: 0   benzonatate  (TESSALON ) 100 MG capsule, Take 1-2 capsules (100-200 mg total) by mouth 3 (three) times daily as needed., Disp: 30 capsule, Rfl: 0   predniSONE  (DELTASONE ) 20 MG tablet, Take 2 tablets (40 mg total) by mouth daily with breakfast., Disp: 10 tablet, Rfl: 0   promethazine -dextromethorphan (PROMETHAZINE -DM) 6.25-15 MG/5ML syrup, Take 5 mLs by mouth 4 (four) times daily as needed., Disp: 118 mL, Rfl: 0   B Complex-C (B-COMPLEX WITH VITAMIN C) tablet, Take 1 tablet by mouth daily., Disp: , Rfl:    Calcium Carb-Cholecalciferol (CALCIUM 600+D3 PO), Take 1 tablet by mouth daily., Disp: , Rfl:    cetirizine (ZYRTEC) 10 MG tablet, Take 10 mg by mouth at bedtime. , Disp: , Rfl:    Collagen Hydrolysate, Bovine, POWD, Take 1 Scoop by mouth daily. WITH COFFEE, Disp: , Rfl:    fluticasone (FLONASE) 50 MCG/ACT nasal spray, Place 1 spray into both nostrils at bedtime., Disp: , Rfl:    Garlic (GARLIQUE) 400 MG TBEC, Take 1 tablet by mouth daily., Disp: , Rfl:    hydrochlorothiazide  (HYDRODIURIL ) 12.5 MG tablet, Take 1 tablet (12.5 mg total) by mouth daily. (Patient taking differently: Take 12.5 mg by mouth daily.), Disp: 90 tablet, Rfl: 3   ibuprofen  (ADVIL ) 600 MG tablet, Take 1 tablet (600 mg total) by mouth every 6 (six) hours as needed., Disp: 60 tablet, Rfl: 3   magnesium oxide (MAG-OX) 400 MG tablet, Take 400 mg by mouth at bedtime. , Disp: , Rfl:    Multiple Vitamin (MULTIVITAMIN WITH MINERALS) TABS tablet, Take 1 tablet by mouth daily., Disp: , Rfl:    omega-3 fish oil (MAXEPA) 1000 MG CAPS capsule, Take 1 capsule by mouth daily., Disp: , Rfl:    sertraline  (ZOLOFT ) 50 MG tablet, Take 1 tablet  (50 mg total) by mouth daily. (Patient taking differently: Take 50 mg by mouth at bedtime.), Disp: 90 tablet, Rfl: 3   Tetrahydrozoline HCl (VISION CLEAR OP), Apply to eye as needed., Disp: , Rfl:    Turmeric 500 MG TABS, Take 1 tablet by mouth daily., Disp: , Rfl:   Observations/Objective: Patient is well-developed, well-nourished in no acute distress.  Resting comfortably at home.  Head is normocephalic, atraumatic.  No labored breathing.  Speech is clear and coherent with logical content.  Patient is alert and oriented at baseline.    Assessment and Plan: 1. Acute bacterial bronchitis (Primary) - albuterol  (VENTOLIN  HFA) 108 (90 Base) MCG/ACT inhaler; Inhale 1-2 puffs into the lungs every 6 (six) hours as needed.  Dispense: 8 g; Refill: 0 - azithromycin  (ZITHROMAX ) 250 MG tablet; Take 2 tablets on day 1, then 1 tablet daily on days 2 through 5  Dispense: 6 tablet; Refill: 0 - predniSONE  (DELTASONE ) 20 MG tablet; Take 2 tablets (40 mg total) by mouth daily with breakfast.  Dispense: 10 tablet; Refill:  0 - benzonatate  (TESSALON ) 100 MG capsule; Take 1-2 capsules (100-200 mg total) by mouth 3 (three) times daily as needed.  Dispense: 30 capsule; Refill: 0 - promethazine -dextromethorphan (PROMETHAZINE -DM) 6.25-15 MG/5ML syrup; Take 5 mLs by mouth 4 (four) times daily as needed.  Dispense: 118 mL; Refill: 0  - Worsening over a week despite OTC medications - Will treat with Z-pack, Prednisone , Albuterol , Promethazine  DM, and tessalon  perles - Can continue Mucinex (daytime); Promethazine  DM at bedtime - Push fluids.  - Rest.  - Steam and humidifier can help - Seek in person evaluation if worsening or symptoms fail to improve    Follow Up Instructions: I discussed the assessment and treatment plan with the patient. The patient was provided an opportunity to ask questions and all were answered. The patient agreed with the plan and demonstrated an understanding of the instructions.  A copy of  instructions were sent to the patient via MyChart unless otherwise noted below.    The patient was advised to call back or seek an in-person evaluation if the symptoms worsen or if the condition fails to improve as anticipated.    Angelia Kelp, PA-C

## 2023-05-11 NOTE — Patient Instructions (Signed)
 Kim Pen, thank you for joining Angelia Kelp, PA-C for today's virtual visit.  While this provider is not your primary care provider (PCP), if your PCP is located in our provider database this encounter information will be shared with them immediately following your visit.   A [facility] MyChart account gives you access to today's visit and all your visits, tests, and labs performed at [facility] " click here if you don't have a [facility] MyChart account or go to [email or web address]  Consent: (Patient) Leslie Ross provided verbal consent for this virtual visit at the beginning of the encounter.  Current Medications:  Current Outpatient Medications:    albuterol  (VENTOLIN  HFA) 108 (90 Base) MCG/ACT inhaler, Inhale 1-2 puffs into the lungs every 6 (six) hours as needed., Disp: 8 g, Rfl: 0   azithromycin  (ZITHROMAX ) 250 MG tablet, Take 2 tablets on day 1, then 1 tablet daily on days 2 through 5, Disp: 6 tablet, Rfl: 0   benzonatate  (TESSALON ) 100 MG capsule, Take 1-2 capsules (100-200 mg total) by mouth 3 (three) times daily as needed., Disp: 30 capsule, Rfl: 0   predniSONE  (DELTASONE ) 20 MG tablet, Take 2 tablets (40 mg total) by mouth daily with breakfast., Disp: 10 tablet, Rfl: 0   promethazine -dextromethorphan (PROMETHAZINE -DM) 6.25-15 MG/5ML syrup, Take 5 mLs by mouth 4 (four) times daily as needed., Disp: 118 mL, Rfl: 0   B Complex-C (B-COMPLEX WITH VITAMIN C) tablet, Take 1 tablet by mouth daily., Disp: , Rfl:    Calcium Carb-Cholecalciferol (CALCIUM 600+D3 PO), Take 1 tablet by mouth daily., Disp: , Rfl:    cetirizine (ZYRTEC) 10 MG tablet, Take 10 mg by mouth at bedtime. , Disp: , Rfl:    Collagen Hydrolysate, Bovine, POWD, Take 1 Scoop by mouth daily. WITH COFFEE, Disp: , Rfl:    fluticasone (FLONASE) 50 MCG/ACT nasal spray, Place 1 spray into both nostrils at bedtime., Disp: , Rfl:    Garlic (GARLIQUE) 400 MG TBEC, Take 1 tablet by mouth  daily., Disp: , Rfl:    hydrochlorothiazide  (HYDRODIURIL ) 12.5 MG tablet, Take 1 tablet (12.5 mg total) by mouth daily. (Patient taking differently: Take 12.5 mg by mouth daily.), Disp: 90 tablet, Rfl: 3   ibuprofen  (ADVIL ) 600 MG tablet, Take 1 tablet (600 mg total) by mouth every 6 (six) hours as needed., Disp: 60 tablet, Rfl: 3   magnesium oxide (MAG-OX) 400 MG tablet, Take 400 mg by mouth at bedtime. , Disp: , Rfl:    Multiple Vitamin (MULTIVITAMIN WITH MINERALS) TABS tablet, Take 1 tablet by mouth daily., Disp: , Rfl:    omega-3 fish oil (MAXEPA) 1000 MG CAPS capsule, Take 1 capsule by mouth daily., Disp: , Rfl:    sertraline  (ZOLOFT ) 50 MG tablet, Take 1 tablet (50 mg total) by mouth daily. (Patient taking differently: Take 50 mg by mouth at bedtime.), Disp: 90 tablet, Rfl: 3   Tetrahydrozoline HCl (VISION CLEAR OP), Apply to eye as needed., Disp: , Rfl:    Turmeric 500 MG TABS, Take 1 tablet by mouth daily., Disp: , Rfl:    Medications ordered in this encounter:  Meds ordered this encounter  Medications   albuterol  (VENTOLIN  HFA) 108 (90 Base) MCG/ACT inhaler    Sig: Inhale 1-2 puffs into the lungs every 6 (six) hours as needed.    Dispense:  8 g    Refill:  0    Supervising Provider:   Corine Dice [id number]   azithromycin  (ZITHROMAX ) 250 MG tablet  Sig: Take 2 tablets on day 1, then 1 tablet daily on days 2 through 5    Dispense:  6 tablet    Refill:  0    Supervising Provider:   LAMPTEY, PHILIP O [[id number]]   predniSONE  (DELTASONE ) 20 MG tablet    Sig: Take 2 tablets (40 mg total) by mouth daily with breakfast.    Dispense:  10 tablet    Refill:  0    Supervising Provider:   LAMPTEY, PHILIP O [[id number]]   benzonatate  (TESSALON ) 100 MG capsule    Sig: Take 1-2 capsules (100-200 mg total) by mouth 3 (three) times daily as needed.    Dispense:  30 capsule    Refill:  0    Supervising Provider:   LAMPTEY, PHILIP O [[id number]]   promethazine -dextromethorphan (PROMETHAZINE -DM)  6.25-15 MG/5ML syrup    Sig: Take 5 mLs by mouth 4 (four) times daily as needed.    Dispense:  118 mL    Refill:  0    Supervising Provider:   Corine Dice [[id number]]     *If you need refills on other medications prior to your next appointment, please contact your pharmacy*  Follow-Up: Call back or seek an in-person evaluation if the symptoms worsen or if the condition fails to improve as anticipated.  [facility] [phone]  Other Instructions Acute Bronchitis, Adult  Acute bronchitis is sudden inflammation of the main airways (bronchi) that come off the windpipe (trachea) in the lungs. The swelling causes the airways to get smaller and make more mucus than normal. This can make it hard to breathe and can cause coughing or noisy breathing (wheezing). Acute bronchitis may last several weeks. The cough may last longer. Allergies, asthma, and exposure to smoke may make the condition worse. What are the causes? This condition can be caused by germs and by substances that irritate the lungs, including: Cold and flu viruses. The most common cause of this condition is the virus that causes the common cold. Bacteria. This is less common. Breathing in substances that irritate the lungs, including: Smoke from cigarettes and other forms of tobacco. Dust and pollen. Fumes from household cleaning products, gases, or burned fuel. Indoor or outdoor air pollution. What increases the risk? The following factors may make you more likely to develop this condition: A weak body's defense system, also called the immune system. A condition that affects your lungs and breathing, such as asthma. What are the signs or symptoms? Common symptoms of this condition include: Coughing. This may bring up clear, yellow, or green mucus from your lungs (sputum). Wheezing. Runny or stuffy nose. Having too much mucus in your lungs (chest congestion). Shortness of breath. Aches and pains,  including sore throat or chest. How is this diagnosed? This condition is usually diagnosed based on: Your symptoms and medical history. A physical exam. You may also have other tests, including tests to rule out other conditions, such as pneumonia. These tests include: A test of lung function. Test of a mucus sample to look for the presence of bacteria. Tests to check the oxygen level in your blood. Blood tests. Chest X-ray. How is this treated? Most cases of acute bronchitis clear up over time without treatment. Your health care provider may recommend: Drinking more fluids to help thin your mucus so it is easier to cough up. Taking inhaled medicine (inhaler) to improve air flow in and out of your lungs. Using a vaporizer or a humidifier. These are machines  that add water to the air to help you breathe better. Taking a medicine that thins mucus and clears congestion (expectorant). Taking a medicine that prevents or stops coughing (cough suppressant). It is not common to take an antibiotic medicine for this condition. Follow these instructions at home:  Take over-the-counter and prescription medicines only as told by your health care provider. Use an inhaler, vaporizer, or humidifier as told by your health care provider. Take two teaspoons (10 mL) of honey at bedtime to lessen coughing at night. Drink enough fluid to keep your urine pale yellow. Do not use any products that contain nicotine or tobacco. These products include cigarettes, chewing tobacco, and vaping devices, such as e-cigarettes. If you need help quitting, ask your health care provider. Get plenty of rest. Return to your normal activities as told by your health care provider. Ask your health care provider what activities are safe for you. Keep all follow-up visits. This is important. How is this prevented? To lower your risk of getting this condition again: Wash your hands often with soap and water for at least 20  seconds. If soap and water are not available, use hand sanitizer. Avoid contact with people who have cold symptoms. Try not to touch your mouth, nose, or eyes with your hands. Avoid breathing in smoke or chemical fumes. Breathing smoke or chemical fumes will make your condition worse. Get the flu shot every year. Contact a health care provider if: Your symptoms do not improve after 2 weeks. You have trouble coughing up the mucus. Your cough keeps you awake at night. You have a fever. Get help right away if you: Cough up blood. Feel pain in your chest. Have severe shortness of breath. Faint or keep feeling like you are going to faint. Have a severe headache. Have a fever or chills that get worse. These symptoms may represent a serious problem that is an emergency. Do not wait to see if the symptoms will go away. Get medical help right away. Call your local emergency services (911 in the [place] Do not drive yourself to the hospital. Summary Acute bronchitis is inflammation of the main airways (bronchi) that come off the windpipe (trachea) in the lungs. The swelling causes the airways to get smaller and make more mucus than normal. Drinking more fluids can help thin your mucus so it is easier to cough up. Take over-the-counter and prescription medicines only as told by your health care provider. Do not use any products that contain nicotine or tobacco. These products include cigarettes, chewing tobacco, and vaping devices, such as e-cigarettes. If you need help quitting, ask your health care provider. Contact a health care provider if your symptoms do not improve after 2 weeks. This information is not intended to replace advice given to you by your health care provider. Make sure you discuss any questions you have with your health care provider. Document Revised: 06/27/2021 Document Reviewed: 07/18/2020 Elsevier Patient Education  2024 Elsevier Inc.   If you have been instructed to have an  in-person evaluation today at a local Urgent Care facility, please use the link below. It will take you to a list of all of our available [facility], including address, phone number and hours of operation. Please do not delay care.  [facility]  If you or a family member do not have a primary care provider, use the link below to schedule a visit and establish care. When you choose a [facility] primary  care physician or advanced practice provider, you gain a long-term partner in health. Find a Primary Care Provider  Learn more about [facility] in-office and virtual care options: [facility] - Get Care Now

## 2023-05-12 ENCOUNTER — Telehealth: Payer: Self-pay | Admitting: Family Medicine

## 2023-05-13 ENCOUNTER — Telehealth: Payer: Self-pay | Admitting: Family Medicine

## 2023-05-25 ENCOUNTER — Other Ambulatory Visit: Payer: Self-pay | Admitting: Obstetrics and Gynecology

## 2023-05-25 DIAGNOSIS — F419 Anxiety disorder, unspecified: Secondary | ICD-10-CM

## 2023-06-10 ENCOUNTER — Other Ambulatory Visit: Payer: Self-pay | Admitting: Family Medicine

## 2023-06-10 DIAGNOSIS — I10 Essential (primary) hypertension: Secondary | ICD-10-CM

## 2023-06-11 NOTE — Telephone Encounter (Signed)
 Requested Prescriptions  Pending Prescriptions Disp Refills   hydrochlorothiazide (HYDRODIURIL) 12.5 MG tablet [Pharmacy Med Name: HYDROCHLOROTHIAZIDE 12.5 MG TAB] 30 tablet 0    Sig: TAKE ONE TABLET BY MOUTH DAILY     Cardiovascular: Diuretics - Thiazide Failed - 06/11/2023  8:17 AM      Failed - Cr in normal range and within 180 days    Creat  Date Value Ref Range Status  06/19/2022 0.83 0.50 - 1.03 mg/dL Final   Creatinine, Ser  Date Value Ref Range Status  09/08/2022 0.70 0.44 - 1.00 mg/dL Final         Failed - K in normal range and within 180 days    Potassium  Date Value Ref Range Status  09/08/2022 3.6 3.5 - 5.1 mmol/L Final         Failed - Na in normal range and within 180 days    Sodium  Date Value Ref Range Status  09/08/2022 142 135 - 145 mmol/L Final  05/14/2018 138 134 - 144 mmol/L Final         Failed - Valid encounter within last 6 months    Recent Outpatient Visits           11 months ago Annual physical exam   [place] Smitty Cords, DO   1 year ago Annual physical exam   [place] [facility] Smitty Cords, DO   2 years ago Essential hypertension   [place] [facility], Netta Neat, DO   3 years ago Acute non-recurrent sinusitis, unspecified location   [facility], Jodelle Gross, FNP   3 years ago Essential hypertension   [place] Althea Charon, Netta Neat, DO       Future Appointments             In 4 days Althea Charon, Netta Neat, DO [facility]            Passed - Last BP in normal range    BP Readings from Last 1 Encounters:  09/08/22 127/76

## 2023-06-15 ENCOUNTER — Ambulatory Visit (INDEPENDENT_AMBULATORY_CARE_PROVIDER_SITE_OTHER): Payer: Self-pay | Admitting: Family Medicine

## 2023-06-15 ENCOUNTER — Encounter: Payer: Self-pay | Admitting: Family Medicine

## 2023-06-15 VITALS — BP 124/80 | HR 75 | Ht 67.0 in | Wt 269.0 lb

## 2023-06-15 DIAGNOSIS — Z6841 Body Mass Index (BMI) 40.0 and over, adult: Secondary | ICD-10-CM

## 2023-06-15 DIAGNOSIS — R7309 Other abnormal glucose: Secondary | ICD-10-CM

## 2023-06-15 DIAGNOSIS — Z0000 Encounter for general adult medical examination without abnormal findings: Secondary | ICD-10-CM | POA: Diagnosis not present

## 2023-06-15 DIAGNOSIS — I10 Essential (primary) hypertension: Secondary | ICD-10-CM

## 2023-06-15 DIAGNOSIS — E7800 Pure hypercholesterolemia, unspecified: Secondary | ICD-10-CM

## 2023-06-15 MED ORDER — HYDROCHLOROTHIAZIDE 12.5 MG PO TABS
12.5000 mg | ORAL_TABLET | Freq: Every day | ORAL | 3 refills | Status: AC
Start: 2023-06-15 — End: ?

## 2023-06-15 NOTE — Patient Instructions (Addendum)
 Thank you for coming to the office today.  Labs today  Refilled hydrochlorothiazide 12.5 daily 90 + refills  Keep up the great work!  Consider Cologuard 2029  Schedule w/ GYN  DUE for FASTING BLOOD WORK (no food or drink after midnight before the lab appointment, only water or coffee without cream/sugar on the morning of)  SCHEDULE "Lab Only" visit in the morning at the clinic for lab draw in 1 YEAR  - Make sure Lab Only appointment is at about 1 week before your next appointment, so that results will be available  For Lab Results, once available within 2-3 days of blood draw, you can can log in to MyChart online to view your results and a brief explanation. Also, we can discuss results at next follow-up visit.   Please schedule a Follow-up Appointment to: Return for 1 year Annual Physical - any day, 1st am apt - then labs AFTER.  If you have any other questions or concerns, please feel free to call the office or send a message through MyChart. You may also schedule an earlier appointment if necessary.  Additionally, you may be receiving a survey about your experience at our office within a few days to 1 week by e-mail or mail. We value your feedback.  Saralyn Pilar, DO [facility], [place]

## 2023-06-15 NOTE — Progress Notes (Unsigned)
 Subjective:    Patient ID: Leslie Ross, female    DOB: 1965-04-12, 58 y.o.   MRN: [id number]  Leslie Ross is a 58 y.o. female presenting on 06/15/2023 for No chief complaint on file.   HPI  Discussed the use of AI scribe software for clinical note transcription with the patient, who gave verbal consent to proceed.  History of Present Illness           Here for Annual Physical and Lab Review   Pre-Diabetes Elevated A1c Previous A1c 5.8 to 6.3 range previously She has reduced caloric intake  Gym exercise 3 days a week with goal to get to 4 days per week, mix of strength machines and cardio    CHRONIC HTN Morbid Obesity BMI >42   Home BP log has been controlled. Current Meds - HCTZ 12.5mg  needs refill - Taking supplement Garlique Lifestyle: - Diet: reducing sodium salt in diet - Exercise: working on improving exercise now at gym regularly Admits occasional edema in feet/ankles at times, worse if prolong sitting Denies CP, dyspnea, HA, edema, dizziness / lightheadedness   Irregular Menstrual Cycle Followed by OBGYN On OCP   Anxiety Caregiver for mother with Alzheimer's sisters help out as well Doing well with Sertraline 50mg  daily helps manage her daily anxiety and helps with sleep, turns mind off so she can rest and not having any depression symptoms or prior diagnosis     Health Maintenance:   Mammogram 6.2024, at GYN update mammogram yearly.   Up to date colonoscopy on file in chart. Next due 06/2027. (last done 06/2017, good for 10 years) considering Cologuard   Pap smear done OBGYN previously 06/2022, next due 5 years approx 2029         06/15/2023    8:09 AM 06/19/2022    9:50 AM 06/14/2021    8:12 AM  Depression screen PHQ 2/9  Decreased Interest 0 0 0  Down, Depressed, Hopeless 0 0 0  PHQ - 2 Score 0 0 0  Altered sleeping 1 1 2   Tired, decreased energy 0 1 0  Change in appetite 0 0 0  Feeling bad or failure about yourself  0 0 0  Trouble  concentrating 0 0 0  Moving slowly or fidgety/restless 0 0 0  Suicidal thoughts 0 0 0  PHQ-9 Score 1 2 2   Difficult doing work/chores Not difficult at all Not difficult at all Not difficult at all       06/15/2023    8:10 AM 06/19/2022    9:51 AM 06/14/2021    8:13 AM 06/04/2020   10:31 AM  GAD 7 : Generalized Anxiety Score  Nervous, Anxious, on Edge 1 1 2 2   Control/stop worrying 0 0 0 1  Worry too much - different things 1 1 1 1   Trouble relaxing 0 1 1 2   Restless 0 0 0 0  Easily annoyed or irritable 0 0 1 2  Afraid - awful might happen 0 0 0 0  Total GAD 7 Score 2 3 5 8   Anxiety Difficulty Not difficult at all Not difficult at all Not difficult at all Not difficult at all     Past Medical History:  Diagnosis Date   GAD (generalized anxiety disorder)    History of MRSA infection    Hypertension    Lipoma of left shoulder    OSA (obstructive sleep apnea)    08-26-2022  per pt had used cpap,  no longer used since  approx 2014 lost weight   Pre-diabetes    in epic A1c on 06-19-2022  6.3 prediabetic range.  pt denies   Seasonal allergic rhinitis    Wears glasses    Past Surgical History:  Procedure Laterality Date   CESAREAN SECTION  12/1997   COLONOSCOPY WITH PROPOFOL N/A 06/30/2017   Procedure: COLONOSCOPY WITH PROPOFOL;  Surgeon: Midge Minium, MD;  Location: [facility] ENDOSCOPY;  Service: Endoscopy;  Laterality: N/A;   HYSTEROSCOPY WITH D & C N/A 07/26/2019   Procedure: DILATATION AND CURETTAGE /HYSTEROSCOPY;  Surgeon: Vena Austria, MD;  Location: [facility] ORS;  Service: Gynecology;  Laterality: N/A;   INTRAUTERINE DEVICE (IUD) INSERTION N/A 07/26/2019   Procedure: INTRAUTERINE DEVICE (IUD) INSERTION - Mirena;  Surgeon: Vena Austria, MD;  Location: [facility] ORS;  Service: Gynecology;  Laterality: N/A;   TONSILLECTOMY  03/1992   Social History   Socioeconomic History   Marital status: Married    Spouse name: Jillyn Hidden   Number of children: 1   Years of education: 12+    Highest education level: Not on file  Occupational History   Not on file  Tobacco Use   Smoking status: Former    Current packs/day: 0.00    Types: Cigarettes    Start date: 20    Quit date: 2009    Years since quitting: 16.2   Smokeless tobacco: Never  Vaping Use   Vaping status: Never Used  Substance and Sexual Activity   Alcohol use: Yes    Comment: occasionally   Drug use: Not Currently    Comment: 08-26-2022  per pt yrs ago   Sexual activity: Yes    Partners: Male    Birth control/protection: None    Comment: per pt no menstrial cycle since 05/ 2023,  has not had lab work done  Other Topics Concern   Not on file  Social History Narrative   Not on file   Social Drivers of Health   Financial Resource Strain: Not on file  Food Insecurity: Not on file  Transportation Needs: Not on file  Physical Activity: Sufficiently Active (04/17/2017)   Exercise Vital Sign    Days of Exercise per Week: 5 days    Minutes of Exercise per Session: 60 min  Stress: Stress Concern Present (04/17/2017)   [employer] of Occupational Health - Occupational Stress Questionnaire    Feeling of Stress : To some extent  Social Connections: Somewhat Isolated (04/17/2017)   Social Connection and Isolation Panel [NHANES]    Frequency of Communication with Friends and Family: Three times a week    Frequency of Social Gatherings with Friends and Family: Never    Attends Religious Services: Never    Database administrator or Organizations: No    Attends Banker Meetings: Never    Marital Status: Married  Catering manager Violence: Not At Risk (04/17/2017)   Humiliation, Afraid, Rape, and Kick questionnaire    Fear of Current or Ex-Partner: No    Emotionally Abused: No    Physically Abused: No    Sexually Abused: No   Family History  Problem Relation Age of Onset   Dementia Mother 5       Alzheimers   Heart disease Father    Aneurysm Father    Diabetes Maternal Grandmother         type 1   Heart disease Maternal Grandmother    Heart disease Paternal Grandmother    Breast cancer Neg Hx    Current Outpatient  Medications on File Prior to Visit  Medication Sig   albuterol (VENTOLIN HFA) 108 (90 Base) MCG/ACT inhaler Inhale 1-2 puffs into the lungs every 6 (six) hours as needed.   B Complex-C (B-COMPLEX WITH VITAMIN C) tablet Take 1 tablet by mouth daily.   Calcium Carb-Cholecalciferol (CALCIUM 600+D3 PO) Take 1 tablet by mouth daily.   cetirizine (ZYRTEC) 10 MG tablet Take 10 mg by mouth at bedtime.    Collagen Hydrolysate, Bovine, POWD Take 1 Scoop by mouth daily. WITH COFFEE   fluticasone (FLONASE) 50 MCG/ACT nasal spray Place 1 spray into both nostrils at bedtime.   Garlic (GARLIQUE) 400 MG TBEC Take 1 tablet by mouth daily.   ibuprofen (ADVIL) 600 MG tablet Take 1 tablet (600 mg total) by mouth every 6 (six) hours as needed.   magnesium oxide (MAG-OX) 400 MG tablet Take 400 mg by mouth at bedtime.    Multiple Vitamin (MULTIVITAMIN WITH MINERALS) TABS tablet Take 1 tablet by mouth daily.   omega-3 fish oil (MAXEPA) 1000 MG CAPS capsule Take 1 capsule by mouth daily.   sertraline (ZOLOFT) 50 MG tablet Take 1 tablet (50 mg total) by mouth daily. (Patient taking differently: Take 50 mg by mouth at bedtime.)   Tetrahydrozoline HCl (VISION CLEAR OP) Apply to eye as needed.   Turmeric 500 MG TABS Take 1 tablet by mouth daily.   No current facility-administered medications on file prior to visit.    Review of Systems Per HPI unless specifically indicated above     Objective:    BP 124/80   Pulse 75   Ht 5\' 7"  (1.702 m)   Wt 269 lb (122 kg)   LMP 08/21/2021 (Approximate)   SpO2 98%   BMI 42.13 kg/m   Wt Readings from Last 3 Encounters:  06/15/23 269 lb (122 kg)  09/08/22 274 lb 8 oz (124.5 kg)  07/03/22 272 lb (123.4 kg)    Physical Exam  Results for orders placed or performed during the hospital encounter of 09/08/22  I-STAT, chem 8   Collection  Time: 09/08/22  9:43 AM  Result Value Ref Range   Sodium 142 135 - 145 mmol/L   Potassium 3.6 3.5 - 5.1 mmol/L   Chloride 105 98 - 111 mmol/L   BUN 15 6 - 20 mg/dL   Creatinine, Ser 1.30 0.44 - 1.00 mg/dL   Glucose, Bld 865 (H) 70 - 99 mg/dL   Calcium, Ion 7.84 6.96 - 1.40 mmol/L   TCO2 25 22 - 32 mmol/L   Hemoglobin 15.3 (H) 12.0 - 15.0 g/dL   HCT 29.5 28.4 - 13.2 %  Surgical pathology   Collection Time: 09/08/22 12:13 PM  Result Value Ref Range   SURGICAL PATHOLOGY      SURGICAL PATHOLOGY CASE: [id number] PATIENT: Rexene Agent Surgical Pathology Report     Clinical History: Subcutaneous mass (crm)     FINAL MICROSCOPIC DIAGNOSIS:  A. CYST, LEFT UPPER BACK, EXCISION: -  Epidermal inclusion cyst.  B. LIPOMA, LEFT SHOULDER, EXCISION: -  Mature adipose tissue consistent with lipoma.    GROSS DESCRIPTION:  A. Received in formalin labeled with the patients name and "Left upper back cyst" is a largely disrupted tan, cystic structure, 5.4 cm in diameter, filled with tan, grumous material. Representative sections are submitted in cassette A1.  B. Received fresh and subsequently placed in formalin labeled with the patient's name and "Left shoulder lipoma" is a 3.6 x 3.3 x 1.5 cm fragmented piece of yellow-tan, rubbery fibrofatty  tissue entirely covered in a thin, wispy capsule. Sectioning reveals solid, golden-yellow cut surfaces without necrosis, hemorrhage, or calcification. Representative sections are submitted in ca ssette B1.  (LEF 09/08/2022)   Final Diagnosis performed by Orene Desanctis DO.   Electronically signed 09/09/2022 Technical and / or Professional components performed at [facility], [address], [place], [place] [address].  Immunohistochemistry Technical component (if applicable) was performed at [facility]. [address], [address], [place], [place] [address].   IMMUNOHISTOCHEMISTRY DISCLAIMER (if  applicable): Some of these immunohistochemical stains may have been developed and the performance characteristics determine by [facility]. Some may not have been cleared or approved by the U.S. Food and Drug Administration. The FDA has determined that such clearance or approval is not necessary. This test is used for clinical purposes. It should not be regarded as investigational or for research. This laboratory is certified under the Clinical Laboratory Improvement Amendments of 1988 (CLIA-88) as qualified to perform  high complexity clinical laboratory testing.  The controls stained appropriately.   IHC stains are performed on formalin fixed, paraffin embedded tissue using a 3,3"diaminobenzidine (DAB) chromogen and Leica Bond Autostainer System. The staining intensity of the nucleus is score manually and is reported as the percentage of tumor cell nuclei demonstrating specific nuclear staining. The specimens are fixed in 10% Neutral Formalin for at least 6 hours and up to 72hrs. These tests are validated on decalcified tissue. Results should be interpreted with caution given the possibility of false negative results on decalcified specimens. Antibody Clones are as follows ER-clone [id number], PR-clone 16, Ki67- clone MM1. Some of these immunohistochemical stains may have been developed and the performance characteristics determined by [facility].       Assessment & Plan:   Problem List Items Addressed This Visit     Elevated hemoglobin A1c   Relevant Orders   Hemoglobin A1c   Essential hypertension   Relevant Medications   hydrochlorothiazide (HYDRODIURIL) 12.5 MG tablet   Other Relevant Orders   COMPLETE METABOLIC PANEL WITH GFR   CBC with Differential/Platelet   Morbid obesity with BMI of 40.0-44.9, adult (HCC)   Relevant Orders   TSH   Lipid panel   Hemoglobin A1c   COMPLETE METABOLIC PANEL WITH GFR   CBC with Differential/Platelet   Other Visit  Diagnoses       Annual physical exam    -  Primary   Relevant Orders   TSH   Lipid panel   Hemoglobin A1c   COMPLETE METABOLIC PANEL WITH GFR   CBC with Differential/Platelet     Elevated LDL cholesterol level       Relevant Orders   TSH   Lipid panel        Updated Health Maintenance information ***- Reviewed recent lab results with patient Encouraged improvement to lifestyle with diet and exercise -*** Goal of weight loss  Assessment and Plan              Orders Placed This Encounter  Procedures   TSH   Lipid panel    Has the patient fasted?:   Yes   Hemoglobin A1c   COMPLETE METABOLIC PANEL WITH GFR   CBC with Differential/Platelet    Meds ordered this encounter  Medications   hydrochlorothiazide (HYDRODIURIL) 12.5 MG tablet    Sig: Take 1 tablet (12.5 mg total) by mouth daily.    Dispense:  90 tablet    Refill:  3  Follow up plan: Return for 1 year Annual Physical - any day, 1st am apt - then labs AFTER.  Saralyn Pilar, DO [facility] 06/15/2023, 8:12 AM

## 2023-06-16 LAB — CBC WITH DIFFERENTIAL/PLATELET
Absolute Lymphocytes: 1729 {cells}/uL (ref 850–3900)
Absolute Monocytes: 418 {cells}/uL (ref 200–950)
Basophils Absolute: 31 {cells}/uL (ref 0–200)
Basophils Relative: 0.6 %
Eosinophils Absolute: 398 {cells}/uL (ref 15–500)
Eosinophils Relative: 7.8 %
HCT: 47.2 % — ABNORMAL HIGH (ref 35.0–45.0)
Hemoglobin: 15.2 g/dL (ref 11.7–15.5)
MCH: 29.2 pg (ref 27.0–33.0)
MCHC: 32.2 g/dL (ref 32.0–36.0)
MCV: 90.6 fL (ref 80.0–100.0)
MPV: 10.1 fL (ref 7.5–12.5)
Monocytes Relative: 8.2 %
Neutro Abs: 2525 {cells}/uL (ref 1500–7800)
Neutrophils Relative %: 49.5 %
Platelets: 423 10*3/uL — ABNORMAL HIGH (ref 140–400)
RBC: 5.21 10*6/uL — ABNORMAL HIGH (ref 3.80–5.10)
RDW: 11.5 % (ref 11.0–15.0)
Total Lymphocyte: 33.9 %
WBC: 5.1 10*3/uL (ref 3.8–10.8)

## 2023-06-16 LAB — COMPLETE METABOLIC PANEL WITH GFR
AG Ratio: 1.4 (calc) (ref 1.0–2.5)
ALT: 17 U/L (ref 6–29)
AST: 17 U/L (ref 10–35)
Albumin: 4.1 g/dL (ref 3.6–5.1)
Alkaline phosphatase (APISO): 57 U/L (ref 37–153)
BUN: 14 mg/dL (ref 7–25)
CO2: 25 mmol/L (ref 20–32)
Calcium: 9.7 mg/dL (ref 8.6–10.4)
Chloride: 107 mmol/L (ref 98–110)
Creat: 0.72 mg/dL (ref 0.50–1.03)
Globulin: 3 g/dL (ref 1.9–3.7)
Glucose, Bld: 108 mg/dL — ABNORMAL HIGH (ref 65–99)
Potassium: 4.3 mmol/L (ref 3.5–5.3)
Sodium: 141 mmol/L (ref 135–146)
Total Bilirubin: 0.5 mg/dL (ref 0.2–1.2)
Total Protein: 7.1 g/dL (ref 6.1–8.1)
eGFR: 99 mL/min/{1.73_m2} (ref >=60)

## 2023-06-16 LAB — LIPID PANEL
Cholesterol: 211 mg/dL — ABNORMAL HIGH (ref <200)
HDL: 52 mg/dL (ref >=50)
LDL Cholesterol (Calc): 137 mg/dL — ABNORMAL HIGH
Non-HDL Cholesterol (Calc): 159 mg/dL — ABNORMAL HIGH (ref <130)
Total CHOL/HDL Ratio: 4.1 (calc) (ref <5.0)
Triglycerides: 117 mg/dL (ref <150)

## 2023-06-16 LAB — HEMOGLOBIN A1C
Hgb A1c MFr Bld: 6.1 %{Hb} — ABNORMAL HIGH (ref <5.7)
Mean Plasma Glucose: 128 mg/dL
eAG (mmol/L): 7.1 mmol/L

## 2023-06-16 LAB — TSH: TSH: 1.4 m[IU]/L

## 2023-06-18 ENCOUNTER — Encounter: Payer: Self-pay | Admitting: Family Medicine

## 2023-07-08 NOTE — Progress Notes (Unsigned)
 PCP: Smitty Cords, DO   No chief complaint on file.   HPI:      Ms. Leslie Ross is a 58 y.o. G1P1001 whose LMP was Patient's last menstrual period was 08/21/2021 (approximate)., presents today for her annual examination.  Her menses are rare with POPs. Few days light spotting 1/23 and 9/23; no other bleeding/dysmen. Started POPs 2021 due to AUB. S/p hysteroscopy/D&C with Dr. Bonney Aid with endometrial polyps. Hx of leio, ovar cysts. She does have occas vasomotor sx.   Sex activity: single partner, contraception - oral progesterone-only contraceptive. She does not have vaginal dryness.  Last Pap: 07/03/22  Results were: no abnormalities /neg HPV DNA. No hx of abn paps.  Hx of STDs: none  Last mammogram: 09/04/22 Results were: normal--routine follow-up in 12 months There is no FH of breast cancer. There is no FH of ovarian cancer. The patient does do self-breast exams.  Colonoscopy: 2019 with polyp, done by Dr. Servando Snare,  Repeat due after 10 years.   Tobacco use: The patient denies current or previous tobacco use. Alcohol use: social drinker No drug use Exercise: moderately active  She does get adequate calcium and Vitamin D in her diet.  Labs with PCP.  Pt with anxiety, takes sertraline 50 mg daily with sx control. Needs RF.   Patient Active Problem List   Diagnosis Date Noted   Abnormal uterine bleeding (AUB)    Thickened endometrium    Cervical polyp    Menometrorrhagia 05/23/2019   Essential hypertension 07/13/2018   Elevated hemoglobin A1c 07/13/2018   Special screening for malignant neoplasms, colon    Polyp of sigmoid colon    Anxiety 04/17/2017   Morbid obesity with BMI of 40.0-44.9, adult (HCC) 04/17/2017    Past Surgical History:  Procedure Laterality Date   CESAREAN SECTION  12/1997   COLONOSCOPY WITH PROPOFOL N/A 06/30/2017   Procedure: COLONOSCOPY WITH PROPOFOL;  Surgeon: Midge Minium, MD;  Location: [facility] ENDOSCOPY;  Service: Endoscopy;   Laterality: N/A;   HYSTEROSCOPY WITH D & C N/A 07/26/2019   Procedure: DILATATION AND CURETTAGE /HYSTEROSCOPY;  Surgeon: Vena Austria, MD;  Location: [facility] ORS;  Service: Gynecology;  Laterality: N/A;   INTRAUTERINE DEVICE (IUD) INSERTION N/A 07/26/2019   Procedure: INTRAUTERINE DEVICE (IUD) INSERTION - Mirena;  Surgeon: Vena Austria, MD;  Location: [facility] ORS;  Service: Gynecology;  Laterality: N/A;   TONSILLECTOMY  03/1992    Family History  Problem Relation Age of Onset   Dementia Mother 71       Alzheimers   Heart disease Father    Aneurysm Father    Diabetes Maternal Grandmother        type 1   Heart disease Maternal Grandmother    Heart disease Paternal Grandmother    Breast cancer Neg Hx     Social History   Socioeconomic History   Marital status: Married    Spouse name: Jillyn Hidden   Number of children: 1   Years of education: 12+   Highest education level: Not on file  Occupational History   Not on file  Tobacco Use   Smoking status: Former    Current packs/day: 0.00    Types: Cigarettes    Start date: 39    Quit date: 2009    Years since quitting: 16.2   Smokeless tobacco: Never  Vaping Use   Vaping status: Never Used  Substance and Sexual Activity   Alcohol use: Yes    Comment: occasionally   Drug use:  Not Currently    Comment: 08-26-2022  per pt yrs ago   Sexual activity: Yes    Partners: Male    Birth control/protection: None    Comment: per pt no menstrial cycle since 05/ 2023,  has not had lab work done  Other Topics Concern   Not on file  Social History Narrative   Not on file   Social Drivers of Health   Financial Resource Strain: Not on file  Food Insecurity: Not on file  Transportation Needs: Not on file  Physical Activity: Sufficiently Active (04/17/2017)   Exercise Vital Sign    Days of Exercise per Week: 5 days    Minutes of Exercise per Session: 60 min  Stress: Stress Concern Present (04/17/2017)   [employer] of  Occupational Health - Occupational Stress Questionnaire    Feeling of Stress : To some extent  Social Connections: Somewhat Isolated (04/17/2017)   Social Connection and Isolation Panel [NHANES]    Frequency of Communication with Friends and Family: Three times a week    Frequency of Social Gatherings with Friends and Family: Never    Attends Religious Services: Never    Database administrator or Organizations: No    Attends Banker Meetings: Never    Marital Status: Married  Catering manager Violence: Not At Risk (04/17/2017)   Humiliation, Afraid, Rape, and Kick questionnaire    Fear of Current or Ex-Partner: No    Emotionally Abused: No    Physically Abused: No    Sexually Abused: No     Current Outpatient Medications:    albuterol (VENTOLIN HFA) 108 (90 Base) MCG/ACT inhaler, Inhale 1-2 puffs into the lungs every 6 (six) hours as needed., Disp: 8 g, Rfl: 0   B Complex-C (B-COMPLEX WITH VITAMIN C) tablet, Take 1 tablet by mouth daily., Disp: , Rfl:    Calcium Carb-Cholecalciferol (CALCIUM 600+D3 PO), Take 1 tablet by mouth daily., Disp: , Rfl:    cetirizine (ZYRTEC) 10 MG tablet, Take 10 mg by mouth at bedtime. , Disp: , Rfl:    Collagen Hydrolysate, Bovine, POWD, Take 1 Scoop by mouth daily. WITH COFFEE, Disp: , Rfl:    fluticasone (FLONASE) 50 MCG/ACT nasal spray, Place 1 spray into both nostrils at bedtime., Disp: , Rfl:    Garlic (GARLIQUE) 400 MG TBEC, Take 1 tablet by mouth daily., Disp: , Rfl:    hydrochlorothiazide (HYDRODIURIL) 12.5 MG tablet, Take 1 tablet (12.5 mg total) by mouth daily., Disp: 90 tablet, Rfl: 3   ibuprofen (ADVIL) 600 MG tablet, Take 1 tablet (600 mg total) by mouth every 6 (six) hours as needed., Disp: 60 tablet, Rfl: 3   magnesium oxide (MAG-OX) 400 MG tablet, Take 400 mg by mouth at bedtime. , Disp: , Rfl:    Multiple Vitamin (MULTIVITAMIN WITH MINERALS) TABS tablet, Take 1 tablet by mouth daily., Disp: , Rfl:    omega-3 fish oil (MAXEPA)  1000 MG CAPS capsule, Take 1 capsule by mouth daily., Disp: , Rfl:    sertraline (ZOLOFT) 50 MG tablet, Take 1 tablet (50 mg total) by mouth daily. (Patient taking differently: Take 50 mg by mouth at bedtime.), Disp: 90 tablet, Rfl: 3   Tetrahydrozoline HCl (VISION CLEAR OP), Apply to eye as needed., Disp: , Rfl:    Turmeric 500 MG TABS, Take 1 tablet by mouth daily., Disp: , Rfl:      ROS:  Review of Systems  Constitutional:  Negative for fatigue, fever and unexpected weight change.  Respiratory:  Negative for cough, shortness of breath and wheezing.   Cardiovascular:  Negative for chest pain, palpitations and leg swelling.  Gastrointestinal:  Negative for blood in stool, constipation, diarrhea, nausea and vomiting.  Endocrine: Negative for cold intolerance, heat intolerance and polyuria.  Genitourinary:  Negative for dyspareunia, dysuria, flank pain, frequency, genital sores, hematuria, menstrual problem, pelvic pain, urgency, vaginal bleeding, vaginal discharge and vaginal pain.  Musculoskeletal:  Negative for back pain, joint swelling and myalgias.  Skin:  Negative for rash.  Neurological:  Negative for dizziness, syncope, light-headedness, numbness and headaches.  Hematological:  Negative for adenopathy.  Psychiatric/Behavioral:  Negative for agitation, confusion, sleep disturbance and suicidal ideas. The patient is not nervous/anxious.    BREAST: No symptoms    Objective: LMP 08/21/2021 (Approximate)    Physical Exam Constitutional:      Appearance: She is well-developed.  Genitourinary:     Vulva normal.     Right Labia: No rash, tenderness or lesions.    Left Labia: No tenderness, lesions or rash.    No vaginal discharge, erythema or tenderness.      Right Adnexa: not tender and no mass present.    Left Adnexa: not tender and no mass present.    No cervical friability or polyp.     Uterus is not enlarged or tender.  Breasts:    Right: No mass, nipple discharge,  skin change or tenderness.     Left: No mass, nipple discharge, skin change or tenderness.  Neck:     Thyroid: No thyromegaly.  Cardiovascular:     Rate and Rhythm: Normal rate and regular rhythm.     Heart sounds: Normal heart sounds. No murmur heard. Pulmonary:     Effort: Pulmonary effort is normal.     Breath sounds: Normal breath sounds.  Abdominal:     Palpations: Abdomen is soft.     Tenderness: There is no abdominal tenderness. There is no guarding or rebound.  Musculoskeletal:        General: Normal range of motion.     Cervical back: Normal range of motion.  Lymphadenopathy:     Cervical: No cervical adenopathy.  Neurological:     General: No focal deficit present.     Mental Status: She is alert and oriented to person, place, and time.     Cranial Nerves: No cranial nerve deficit.  Skin:    General: Skin is warm and dry.  Psychiatric:        Mood and Affect: Mood normal.        Behavior: Behavior normal.        Thought Content: Thought content normal.        Judgment: Judgment normal.  Vitals reviewed.     Assessment/Plan:  Encounter for annual routine gynecological examination  Cervical cancer screening - Plan: Cytology - PAP  Screening for HPV (human papillomavirus) - Plan: Cytology - PAP  Encounter for surveillance of contraceptive pills--will d/c POPs to follow cycles given age/ minimal bleeding. Pt to f/u prn menorrhagia and would restart Rx.   Encounter for screening mammogram for malignant neoplasm of breast - Plan: MM 3D SCREENING MAMMOGRAM BILATERAL BREAST; pt to schedule mammo  Anxiety - Plan: sertraline (ZOLOFT) 50 MG tablet; Rx RF. Doing well   No orders of the defined types were placed in this encounter.           GYN counsel breast self exam, mammography screening, menopause, adequate intake of calcium and vitamin D, diet  and exercise    F/U  No follow-ups on file.  Tona Qualley B. Pearl Berlinger, PA-C 07/08/2023 5:51 PM

## 2023-07-09 ENCOUNTER — Ambulatory Visit (INDEPENDENT_AMBULATORY_CARE_PROVIDER_SITE_OTHER): Admitting: Obstetrics and Gynecology

## 2023-07-09 ENCOUNTER — Encounter: Payer: Self-pay | Admitting: Obstetrics and Gynecology

## 2023-07-09 VITALS — BP 134/78 | Ht 67.0 in | Wt 265.0 lb

## 2023-07-09 DIAGNOSIS — F419 Anxiety disorder, unspecified: Secondary | ICD-10-CM

## 2023-07-09 DIAGNOSIS — Z01419 Encounter for gynecological examination (general) (routine) without abnormal findings: Secondary | ICD-10-CM

## 2023-07-09 DIAGNOSIS — Z1231 Encounter for screening mammogram for malignant neoplasm of breast: Secondary | ICD-10-CM

## 2023-07-09 MED ORDER — SERTRALINE HCL 50 MG PO TABS
50.0000 mg | ORAL_TABLET | Freq: Every day | ORAL | 3 refills | Status: AC
Start: 2023-07-09 — End: ?

## 2023-07-09 NOTE — Patient Instructions (Addendum)
 I value your feedback and you entrusting [place] with your care. If you get a [facility] patient survey, I would appreciate you taking the time to let us know about your experience today. Thank you!  [facility] ([place])--[phone]

## 2023-08-28 ENCOUNTER — Telehealth: Admitting: Family Medicine

## 2023-08-28 DIAGNOSIS — J4540 Moderate persistent asthma, uncomplicated: Secondary | ICD-10-CM

## 2023-08-28 MED ORDER — PROMETHAZINE-DM 6.25-15 MG/5ML PO SYRP: 5.0000 mL | ORAL_SOLUTION | Freq: Four times a day (QID) | ORAL | 0 refills | Status: AC | PRN

## 2023-08-28 MED ORDER — PREDNISONE 20 MG PO TABS: 20.0000 mg | ORAL_TABLET | Freq: Two times a day (BID) | ORAL | 0 refills | Status: AC

## 2023-08-28 MED ORDER — AZITHROMYCIN 250 MG PO TABS: ORAL_TABLET | ORAL | 0 refills | Status: AC

## 2023-08-28 NOTE — Progress Notes (Signed)
 Virtual Visit Consent   [name], you are scheduled for a virtual visit with a [facility] provider today. Just as with appointments in the office, your consent must be obtained to participate. Your consent will be active for this visit and any virtual visit you may have with one of our providers in the next 365 days. If you have a MyChart account, a copy of this consent can be sent to you electronically.  As this is a virtual visit, video technology does not allow for your provider to perform a traditional examination. This may limit your provider's ability to fully assess your condition. If your provider identifies any concerns that need to be evaluated in person or the need to arrange testing (such as labs, EKG, etc.), we will make arrangements to do so. Although advances in technology are sophisticated, we cannot ensure that it will always work on either your end or our end. If the connection with a video visit is poor, the visit may have to be switched to a telephone visit. With either a video or telephone visit, we are not always able to ensure that we have a secure connection.  By engaging in this virtual visit, you consent to the provision of healthcare and authorize for your insurance to be billed (if applicable) for the services provided during this visit. Depending on your insurance coverage, you may receive a charge related to this service.  I need to obtain your verbal consent now. Are you willing to proceed with your visit today? Leslie Ross has provided verbal consent on 08/28/2023 for a virtual visit (video or telephone). Albertha Huger, FNP  Date: 08/28/2023 3:03 PM   Virtual Visit via Video Note   I, Albertha Huger, connected with  Leslie Ross  ([id number], 20-Oct-1965) on 08/28/23 at  3:00 PM EDT by a video-enabled telemedicine application and verified that I am speaking with the correct person using two identifiers.  Location: Patient: Virtual Visit Location Patient:  Home Provider: Virtual Visit Location Provider: Home Office   I discussed the limitations of evaluation and management by telemedicine and the availability of in person appointments. The patient expressed understanding and agreed to proceed.    History of Present Illness: Leslie Ross is a 58 y.o. who identifies as a female who was assigned female at birth, and is being seen today for cough, wheezing, sinus pressure, sob, aching for 5 days worsening. In no distress. [name]  HPI: HPI  Problems:  Patient Active Problem List   Diagnosis Date Noted   Abnormal uterine bleeding (AUB)    Thickened endometrium    Cervical polyp    Menometrorrhagia 05/23/2019   Essential hypertension 07/13/2018   Elevated hemoglobin A1c 07/13/2018   Special screening for malignant neoplasms, colon    Polyp of sigmoid colon    Anxiety 04/17/2017   Morbid obesity with BMI of 40.0-44.9, adult (HCC) 04/17/2017    Allergies: No Known Allergies Medications:  Current Outpatient Medications:    albuterol  (VENTOLIN  HFA) 108 (90 Base) MCG/ACT inhaler, Inhale 1-2 puffs into the lungs every 6 (six) hours as needed., Disp: 8 g, Rfl: 0   B Complex-C (B-COMPLEX WITH VITAMIN C) tablet, Take 1 tablet by mouth daily., Disp: , Rfl:    Calcium Carb-Cholecalciferol (CALCIUM 600+D3 PO), Take 1 tablet by mouth daily., Disp: , Rfl:    cetirizine (ZYRTEC) 10 MG tablet, Take 10 mg by mouth at bedtime. , Disp: , Rfl:    Collagen Hydrolysate, Bovine, POWD,  Take 1 Scoop by mouth daily. WITH COFFEE, Disp: , Rfl:    fluticasone (FLONASE) 50 MCG/ACT nasal spray, Place 1 spray into both nostrils at bedtime., Disp: , Rfl:    Garlic (GARLIQUE) 400 MG TBEC, Take 1 tablet by mouth daily., Disp: , Rfl:    hydrochlorothiazide  (HYDRODIURIL ) 12.5 MG tablet, Take 1 tablet (12.5 mg total) by mouth daily., Disp: 90 tablet, Rfl: 3   ibuprofen  (ADVIL ) 600 MG tablet, Take 1 tablet (600 mg total) by mouth every 6 (six) hours as needed., Disp: 60 tablet,  Rfl: 3   magnesium oxide (MAG-OX) 400 MG tablet, Take 400 mg by mouth at bedtime. , Disp: , Rfl:    Multiple Vitamin (MULTIVITAMIN WITH MINERALS) TABS tablet, Take 1 tablet by mouth daily., Disp: , Rfl:    omega-3 fish oil (MAXEPA) 1000 MG CAPS capsule, Take 1 capsule by mouth daily., Disp: , Rfl:    sertraline  (ZOLOFT ) 50 MG tablet, Take 1 tablet (50 mg total) by mouth at bedtime., Disp: 90 tablet, Rfl: 3   Tetrahydrozoline HCl (VISION CLEAR OP), Apply to eye as needed., Disp: , Rfl:    Turmeric 500 MG TABS, Take 1 tablet by mouth daily., Disp: , Rfl:   Observations/Objective: Patient is well-developed, well-nourished in no acute distress.  Resting comfortably  at home.  Head is normocephalic, atraumatic.  No labored breathing.  Speech is clear and coherent with logical content.  Patient is alert and oriented at baseline.    Assessment and Plan: 1. 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   Follow Up Instructions: I discussed the assessment and treatment plan with the patient. The patient was provided an opportunity to ask questions and all were answered. The patient agreed with the plan and demonstrated an understanding of the instructions.  A copy of instructions were sent to the patient via MyChart unless otherwise noted below.     The patient was advised to call back or seek an in-person evaluation if the symptoms worsen or if the condition fails to improve as anticipated.    [name], FNP

## 2023-08-28 NOTE — Patient Instructions (Signed)

## 2023-09-07 ENCOUNTER — Ambulatory Visit
Admission: RE | Admit: 2023-09-07 | Discharge: 2023-09-07 | Disposition: A | Discharge status: Home / Self Care | Source: Ambulatory Visit | Attending: Obstetrics and Gynecology | Admitting: Obstetrics and Gynecology

## 2023-09-07 DIAGNOSIS — Z1231 Encounter for screening mammogram for malignant neoplasm of breast: Secondary | ICD-10-CM | POA: Insufficient documentation

## 2023-09-10 ENCOUNTER — Ambulatory Visit: Payer: Self-pay | Admitting: Obstetrics and Gynecology

## 2024-06-15 ENCOUNTER — Encounter: Admitting: Family Medicine
